# Patient Record
Sex: Male | Born: 1945 | Race: White | Hispanic: No | Marital: Married
Health system: Southern US, Community
[De-identification: ages and names within clinical notes are randomized; demographics above are authoritative.]

---

## 2004-10-07 ENCOUNTER — Ambulatory Visit: Payer: Self-pay | Admitting: Internal Medicine

## 2004-10-20 ENCOUNTER — Ambulatory Visit: Payer: Self-pay | Admitting: Internal Medicine

## 2004-11-20 ENCOUNTER — Ambulatory Visit: Payer: Self-pay | Admitting: Internal Medicine

## 2005-01-12 ENCOUNTER — Ambulatory Visit: Payer: Self-pay | Admitting: Internal Medicine

## 2005-01-20 ENCOUNTER — Ambulatory Visit: Payer: Self-pay | Admitting: Internal Medicine

## 2005-02-24 ENCOUNTER — Ambulatory Visit: Payer: Self-pay | Admitting: Internal Medicine

## 2005-03-22 ENCOUNTER — Ambulatory Visit: Payer: Self-pay | Admitting: Internal Medicine

## 2005-04-27 ENCOUNTER — Ambulatory Visit: Payer: Self-pay | Admitting: Internal Medicine

## 2005-05-20 ENCOUNTER — Ambulatory Visit: Payer: Self-pay | Admitting: Internal Medicine

## 2005-06-20 ENCOUNTER — Ambulatory Visit: Payer: Self-pay | Admitting: Internal Medicine

## 2005-07-20 ENCOUNTER — Ambulatory Visit: Payer: Self-pay | Admitting: Internal Medicine

## 2005-08-20 ENCOUNTER — Ambulatory Visit: Payer: Self-pay | Admitting: Internal Medicine

## 2005-09-19 ENCOUNTER — Ambulatory Visit: Payer: Self-pay | Admitting: Internal Medicine

## 2005-10-19 ENCOUNTER — Ambulatory Visit: Payer: Self-pay | Admitting: Internal Medicine

## 2005-10-20 ENCOUNTER — Ambulatory Visit: Payer: Self-pay | Admitting: Internal Medicine

## 2005-11-23 ENCOUNTER — Ambulatory Visit: Payer: Self-pay | Admitting: Internal Medicine

## 2005-12-20 ENCOUNTER — Ambulatory Visit: Payer: Self-pay | Admitting: Internal Medicine

## 2006-01-20 ENCOUNTER — Ambulatory Visit: Payer: Self-pay | Admitting: Internal Medicine

## 2006-02-19 ENCOUNTER — Ambulatory Visit: Payer: Self-pay | Admitting: Internal Medicine

## 2006-03-22 ENCOUNTER — Ambulatory Visit: Payer: Self-pay | Admitting: Internal Medicine

## 2006-04-22 ENCOUNTER — Ambulatory Visit: Payer: Self-pay | Admitting: Internal Medicine

## 2006-05-21 ENCOUNTER — Ambulatory Visit: Payer: Self-pay | Admitting: Internal Medicine

## 2006-06-21 ENCOUNTER — Ambulatory Visit: Payer: Self-pay | Admitting: Internal Medicine

## 2006-07-21 ENCOUNTER — Ambulatory Visit: Payer: Self-pay | Admitting: Internal Medicine

## 2006-08-21 ENCOUNTER — Ambulatory Visit: Payer: Self-pay | Admitting: Internal Medicine

## 2006-09-20 ENCOUNTER — Ambulatory Visit: Payer: Self-pay | Admitting: Internal Medicine

## 2006-10-21 ENCOUNTER — Ambulatory Visit: Payer: Self-pay | Admitting: Internal Medicine

## 2006-11-21 ENCOUNTER — Ambulatory Visit: Payer: Self-pay | Admitting: Internal Medicine

## 2006-12-21 ENCOUNTER — Ambulatory Visit: Payer: Self-pay | Admitting: Internal Medicine

## 2007-01-21 ENCOUNTER — Ambulatory Visit: Payer: Self-pay | Admitting: Internal Medicine

## 2007-02-20 ENCOUNTER — Ambulatory Visit: Payer: Self-pay | Admitting: Internal Medicine

## 2007-03-23 ENCOUNTER — Ambulatory Visit: Payer: Self-pay | Admitting: Internal Medicine

## 2007-04-23 ENCOUNTER — Ambulatory Visit: Payer: Self-pay | Admitting: Internal Medicine

## 2007-05-21 ENCOUNTER — Ambulatory Visit: Payer: Self-pay | Admitting: Internal Medicine

## 2007-06-21 ENCOUNTER — Ambulatory Visit: Payer: Self-pay | Admitting: Internal Medicine

## 2007-07-21 ENCOUNTER — Ambulatory Visit: Payer: Self-pay | Admitting: Internal Medicine

## 2007-08-21 ENCOUNTER — Ambulatory Visit: Payer: Self-pay | Admitting: Internal Medicine

## 2007-09-20 ENCOUNTER — Ambulatory Visit: Payer: Self-pay | Admitting: Internal Medicine

## 2007-10-21 ENCOUNTER — Ambulatory Visit: Payer: Self-pay | Admitting: Internal Medicine

## 2007-11-21 ENCOUNTER — Ambulatory Visit: Payer: Self-pay | Admitting: Internal Medicine

## 2007-12-21 ENCOUNTER — Ambulatory Visit: Payer: Self-pay | Admitting: Internal Medicine

## 2008-01-21 ENCOUNTER — Ambulatory Visit: Payer: Self-pay | Admitting: Internal Medicine

## 2008-02-20 ENCOUNTER — Ambulatory Visit: Payer: Self-pay | Admitting: Internal Medicine

## 2008-03-22 ENCOUNTER — Ambulatory Visit: Payer: Self-pay | Admitting: Internal Medicine

## 2008-04-22 ENCOUNTER — Ambulatory Visit: Payer: Self-pay | Admitting: Internal Medicine

## 2008-05-20 ENCOUNTER — Ambulatory Visit: Payer: Self-pay | Admitting: Internal Medicine

## 2008-06-20 ENCOUNTER — Ambulatory Visit: Payer: Self-pay | Admitting: Internal Medicine

## 2008-07-20 ENCOUNTER — Ambulatory Visit: Payer: Self-pay | Admitting: Internal Medicine

## 2008-08-20 ENCOUNTER — Ambulatory Visit: Payer: Self-pay | Admitting: Internal Medicine

## 2008-09-19 ENCOUNTER — Ambulatory Visit: Payer: Self-pay | Admitting: Internal Medicine

## 2008-10-20 ENCOUNTER — Ambulatory Visit: Payer: Self-pay | Admitting: Internal Medicine

## 2008-11-20 ENCOUNTER — Ambulatory Visit: Payer: Self-pay | Admitting: Internal Medicine

## 2008-12-06 ENCOUNTER — Observation Stay: Payer: Self-pay | Admitting: Oncology

## 2008-12-20 ENCOUNTER — Ambulatory Visit: Payer: Self-pay | Admitting: Internal Medicine

## 2009-01-20 ENCOUNTER — Ambulatory Visit: Payer: Self-pay | Admitting: Internal Medicine

## 2009-02-19 ENCOUNTER — Ambulatory Visit: Payer: Self-pay | Admitting: Internal Medicine

## 2009-03-22 ENCOUNTER — Ambulatory Visit: Payer: Self-pay | Admitting: Internal Medicine

## 2009-04-22 ENCOUNTER — Ambulatory Visit: Payer: Self-pay | Admitting: Internal Medicine

## 2009-05-15 ENCOUNTER — Emergency Department: Payer: Self-pay | Admitting: Emergency Medicine

## 2009-05-20 ENCOUNTER — Ambulatory Visit: Payer: Self-pay | Admitting: Internal Medicine

## 2009-06-20 ENCOUNTER — Ambulatory Visit: Payer: Self-pay | Admitting: Internal Medicine

## 2009-07-20 ENCOUNTER — Ambulatory Visit: Payer: Self-pay | Admitting: Internal Medicine

## 2009-08-20 ENCOUNTER — Ambulatory Visit: Payer: Self-pay | Admitting: Internal Medicine

## 2009-09-19 ENCOUNTER — Ambulatory Visit: Payer: Self-pay | Admitting: Internal Medicine

## 2009-10-20 ENCOUNTER — Ambulatory Visit: Payer: Self-pay | Admitting: Internal Medicine

## 2009-11-20 ENCOUNTER — Ambulatory Visit: Payer: Self-pay | Admitting: Internal Medicine

## 2009-12-20 ENCOUNTER — Ambulatory Visit: Payer: Self-pay | Admitting: Internal Medicine

## 2010-01-20 ENCOUNTER — Ambulatory Visit: Payer: Self-pay | Admitting: Internal Medicine

## 2010-02-19 ENCOUNTER — Ambulatory Visit: Payer: Self-pay | Admitting: Internal Medicine

## 2010-03-22 ENCOUNTER — Ambulatory Visit: Payer: Self-pay | Admitting: Internal Medicine

## 2010-04-22 ENCOUNTER — Ambulatory Visit: Payer: Self-pay | Admitting: Internal Medicine

## 2010-05-21 ENCOUNTER — Ambulatory Visit: Payer: Self-pay | Admitting: Internal Medicine

## 2010-06-21 ENCOUNTER — Ambulatory Visit: Payer: Self-pay | Admitting: Internal Medicine

## 2010-07-21 ENCOUNTER — Ambulatory Visit: Payer: Self-pay | Admitting: Internal Medicine

## 2010-08-21 ENCOUNTER — Ambulatory Visit: Payer: Self-pay | Admitting: Internal Medicine

## 2010-09-20 ENCOUNTER — Ambulatory Visit: Payer: Self-pay | Admitting: Internal Medicine

## 2010-10-21 ENCOUNTER — Ambulatory Visit: Payer: Self-pay | Admitting: Internal Medicine

## 2010-11-21 ENCOUNTER — Ambulatory Visit: Payer: Self-pay | Admitting: Internal Medicine

## 2010-12-21 ENCOUNTER — Ambulatory Visit: Payer: Self-pay | Admitting: Internal Medicine

## 2010-12-21 ENCOUNTER — Ambulatory Visit: Payer: Self-pay | Admitting: General Practice

## 2010-12-22 ENCOUNTER — Ambulatory Visit: Payer: Self-pay | Admitting: Internal Medicine

## 2011-01-21 ENCOUNTER — Ambulatory Visit: Payer: Self-pay | Admitting: Internal Medicine

## 2011-02-20 ENCOUNTER — Ambulatory Visit: Payer: Self-pay | Admitting: Internal Medicine

## 2011-03-23 ENCOUNTER — Ambulatory Visit: Payer: Self-pay | Admitting: Internal Medicine

## 2011-04-13 ENCOUNTER — Ambulatory Visit: Payer: Self-pay | Admitting: Surgery

## 2011-04-19 LAB — COMPREHENSIVE METABOLIC PANEL
Albumin: 3.9 g/dL (ref 3.4–5.0)
Anion Gap: 5 — ABNORMAL LOW (ref 7–16)
BUN: 21 mg/dL — ABNORMAL HIGH (ref 7–18)
Calcium, Total: 8.9 mg/dL (ref 8.5–10.1)
Chloride: 104 mmol/L (ref 98–107)
Co2: 33 mmol/L — ABNORMAL HIGH (ref 21–32)
Creatinine: 1.28 mg/dL (ref 0.60–1.30)
EGFR (African American): >60
EGFR (Non-African Amer.): >60
Glucose: 122 mg/dL — ABNORMAL HIGH (ref 65–99)
Osmolality: 287 (ref 275–301)
Potassium: 3.9 mmol/L (ref 3.5–5.1)
Sodium: 142 mmol/L (ref 136–145)
Total Protein: 7.1 g/dL (ref 6.4–8.2)

## 2011-04-19 LAB — CBC CANCER CENTER
Eosinophil #: 0 x10 3/mm (ref 0.0–0.7)
HCT: 45.6 % (ref 40.0–52.0)
Lymphocyte #: 1.8 x10 3/mm (ref 1.0–3.6)
MCH: 30.1 pg (ref 26.0–34.0)
MCHC: 34.4 g/dL (ref 32.0–36.0)
Monocyte #: 0.5 x10 3/mm (ref 0.0–0.7)
Neutrophil #: 3 x10 3/mm (ref 1.4–6.5)
Neutrophil %: 56.4 %
Platelet: 246 x10 3/mm (ref 150–440)

## 2011-04-20 LAB — KAPPA/LAMBDA FREE LIGHT CHAINS (ARMC)

## 2011-04-23 ENCOUNTER — Ambulatory Visit: Payer: Self-pay | Admitting: Internal Medicine

## 2011-05-01 IMAGING — CR RIGHT HIP - COMPLETE 2+ VIEW
1 series · 2 of 2 positions shown · non-contrast
Comparison: none

REASON FOR EXAM: myeloma
COMMENTS:

[Series 1: view not recorded · 0.17mm/px · 2 of 2 slices shown]
[im 1/2]
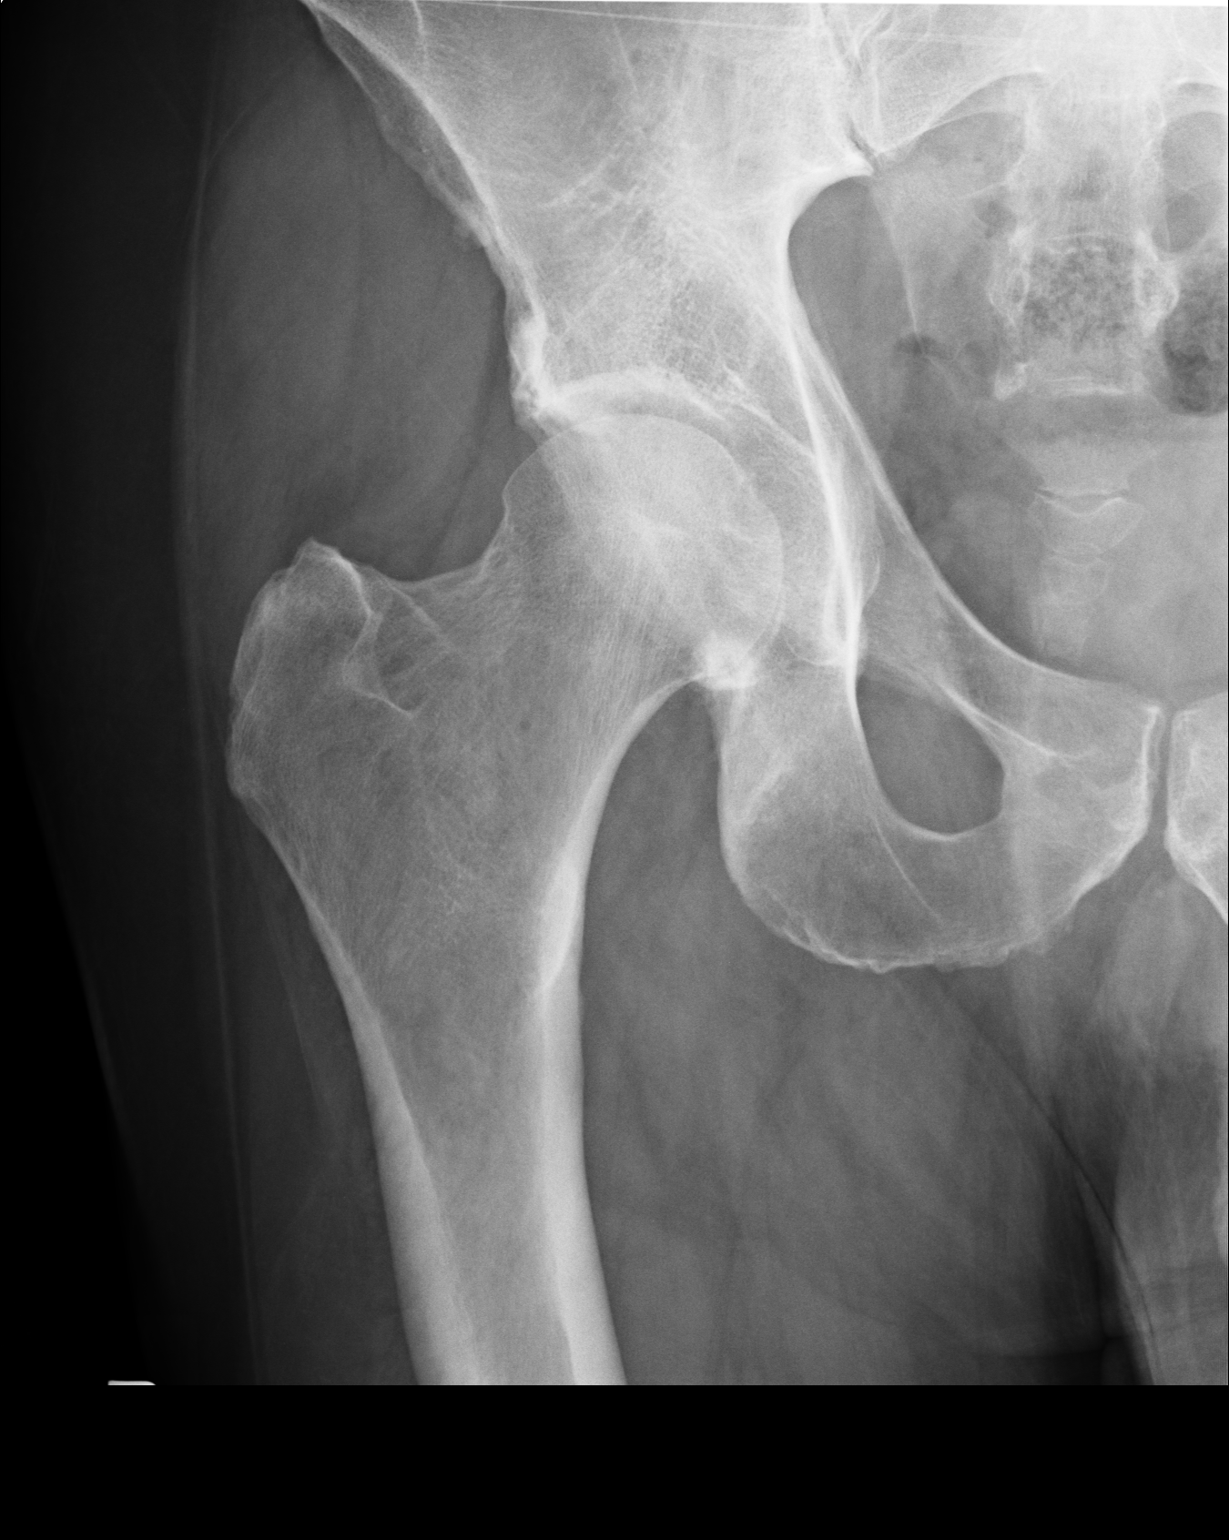
[im 2/2]
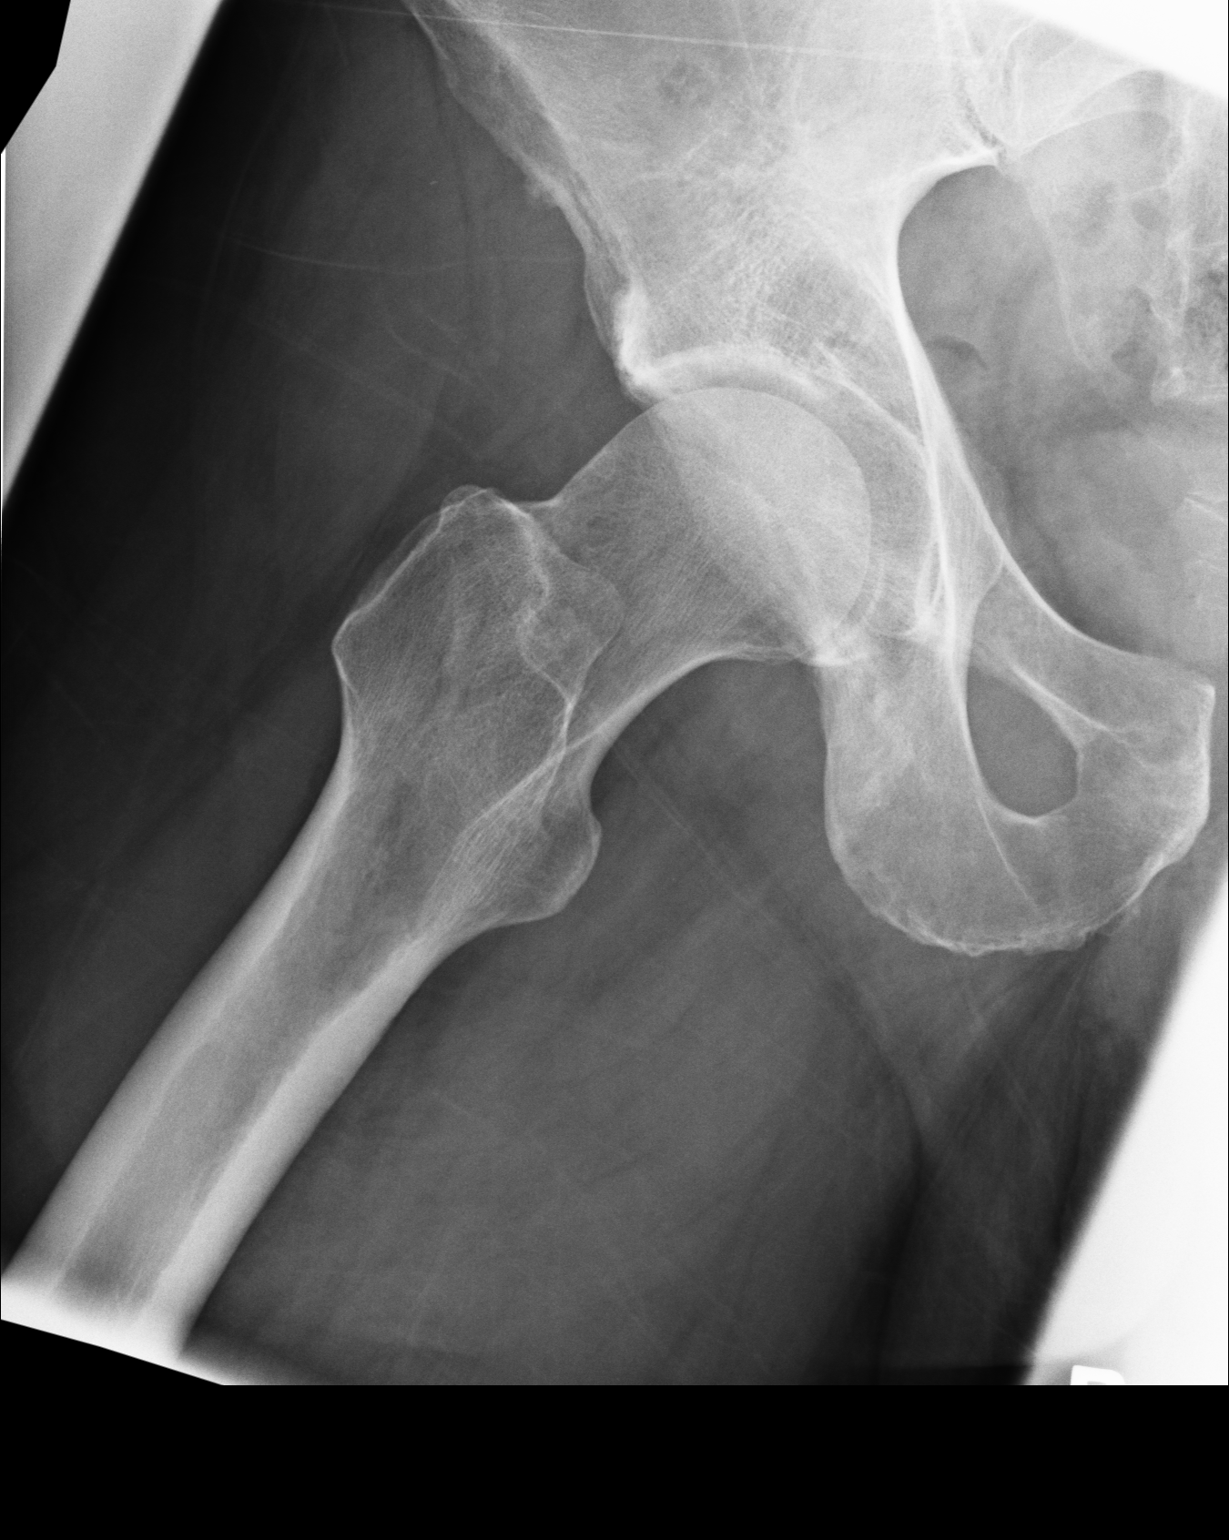

[2 of 2 positions shown; findings below may reference images not displayed]

PROCEDURE:     DXR - DXR HIP RIGHT COMPLETE  - July 15, 2009 [DATE]

RESULT:     Comparison is made to the prior exam 12/06/2008. No fracture or
dislocation is seen. There is noted mild demineralization the femoral neck,
similar to that noted on the left and which does not appear appreciably
changed since the prior exam. Findings consequently are felt to unlikely be
secondary to multiple myeloma. The hip joint space is well-maintained.
IMPRESSION: No acute changes are identified.

## 2011-05-01 IMAGING — CR DG HUMERUS 2V *R*
1 series · 2 of 2 positions shown · non-contrast
Comparison: none

REASON FOR EXAM: myeloma
COMMENTS:

PROCEDURE:     DXR - DXR HUMERUS RIGHT  - July 15, 2009 [DATE]
RESULT:     Two views of the right humerus were obtained and are compared to
the appearance of the humerus on prior exam of 08/20/2008. No fracture is
seen. No lytic or blastic lesions are identified.

[Series 1: view not recorded · 0.17mm/px · 2 of 2 slices shown]
[im 1/2]
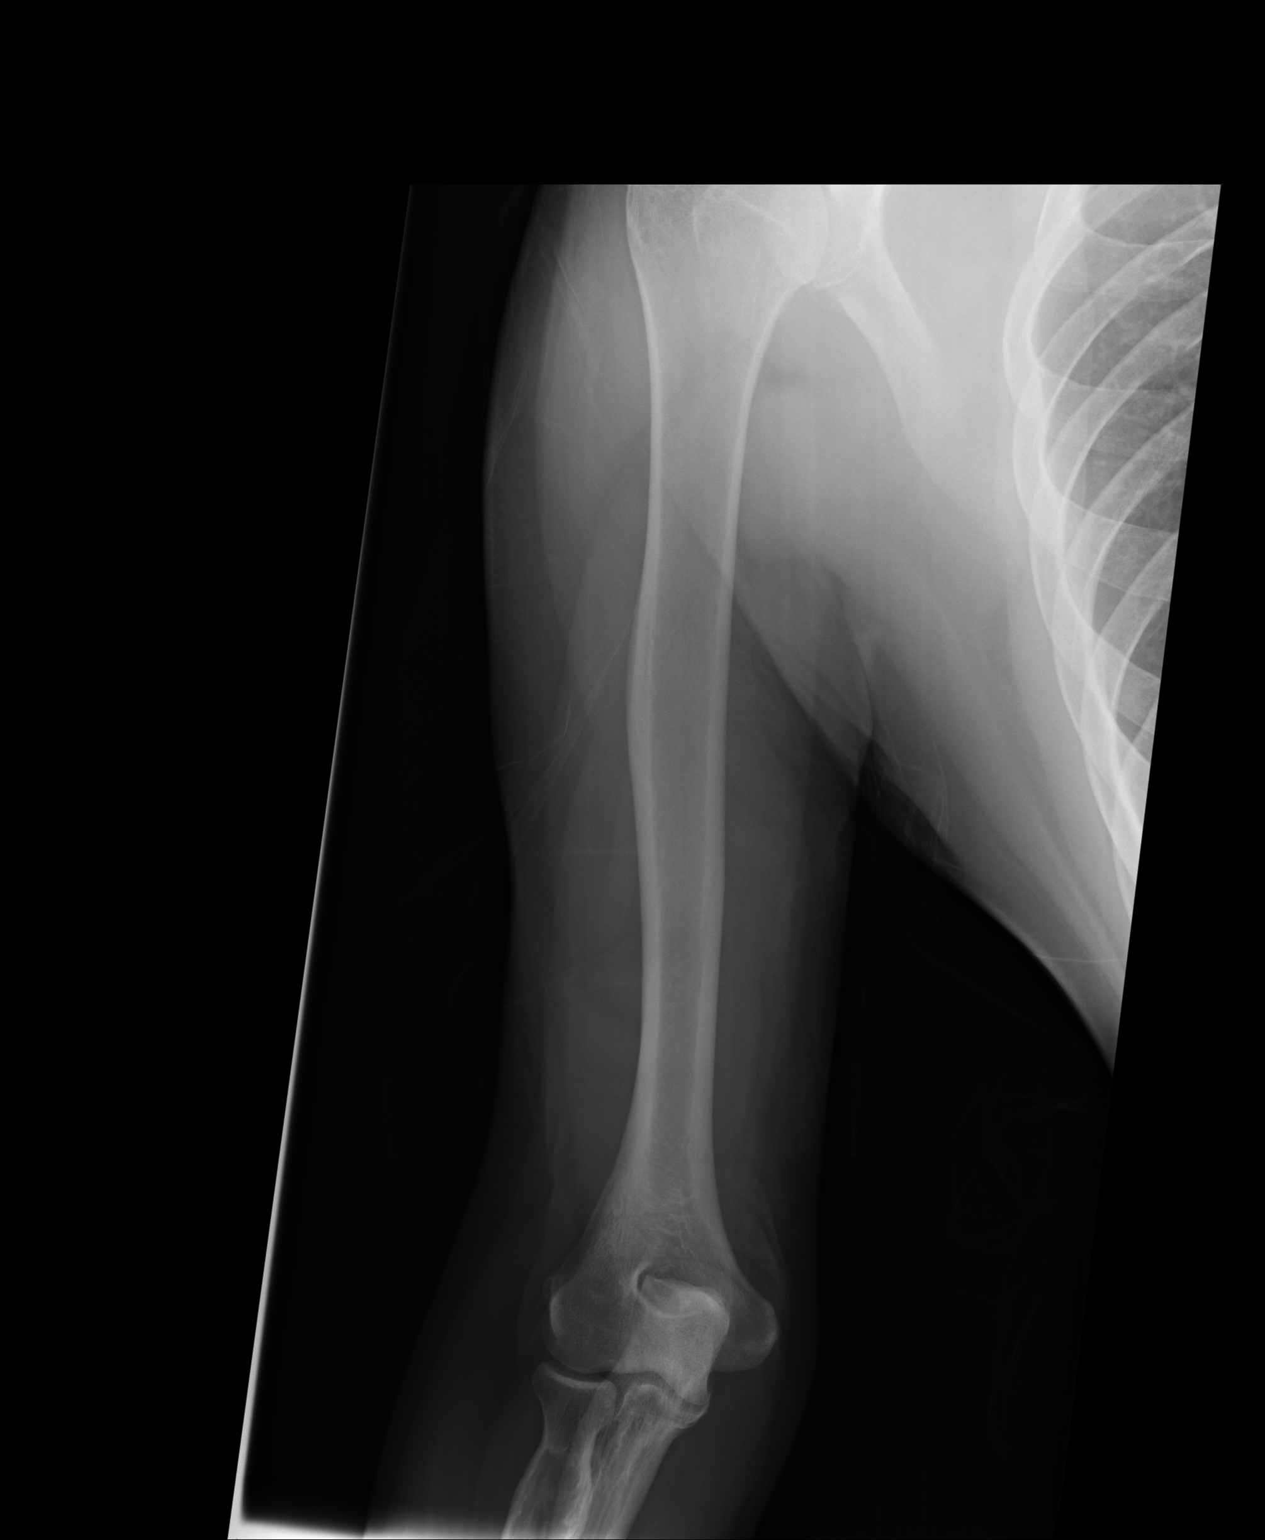
[im 2/2]
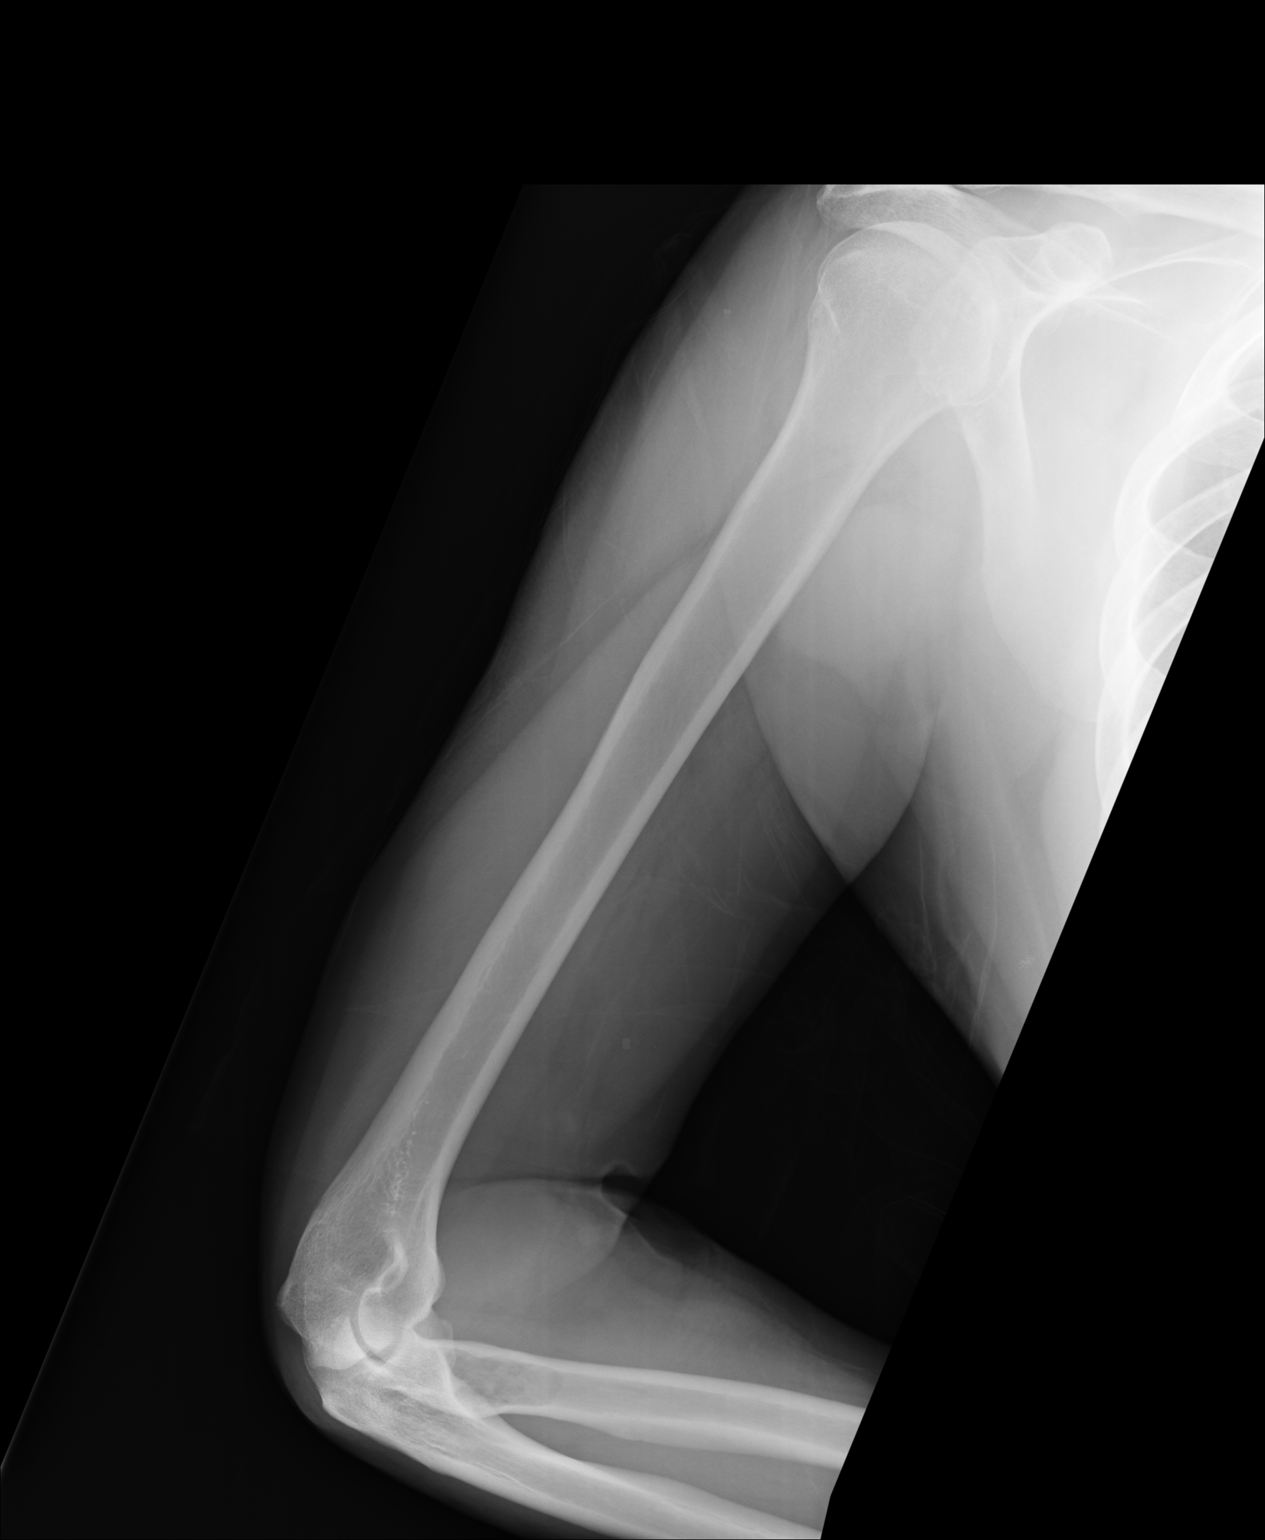

[2 of 2 positions shown; findings below may reference images not displayed]

IMPRESSION: No significant osseous abnormalities are noted.

## 2011-05-01 IMAGING — CR DG FEMUR 2V*L*
1 series · 4 of 4 positions shown · non-contrast
Comparison: none

REASON FOR EXAM: myeloma
COMMENTS:

PROCEDURE:     DXR - DXR FEMUR LEFT  - July 15, 2009 [DATE]
RESULT:     Two views of the left femur are compared to the appearance of
the left femur on a prior exam of 08/20/2008. No fracture is seen. No definite
lytic or blastic lesions are identified.

[Series 1: view not recorded · 0.17mm/px · 4 of 4 slices shown]
[im 1/4]
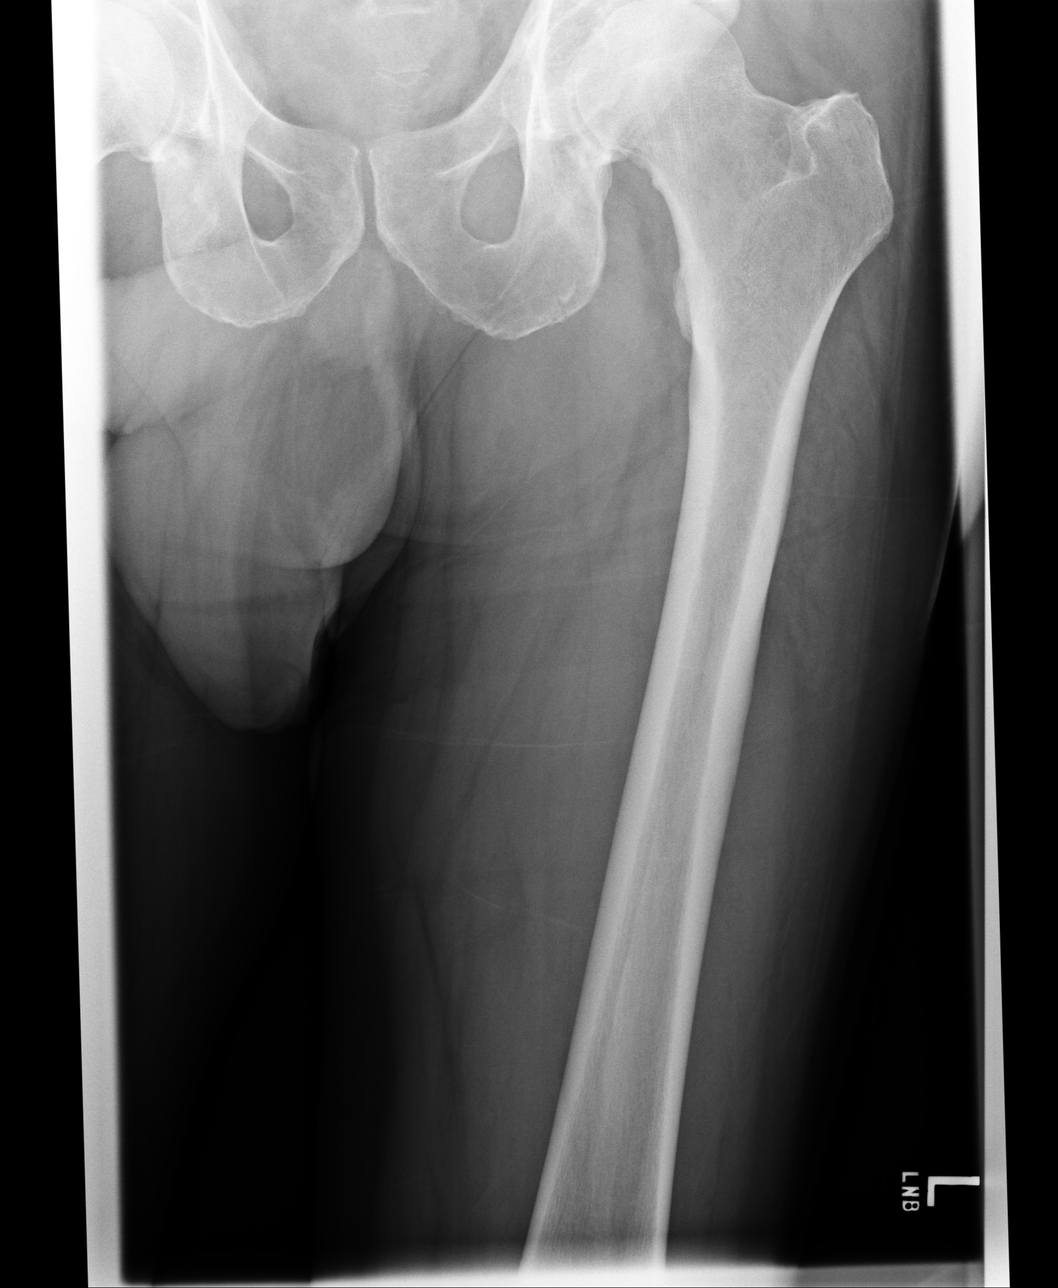
[im 2/4]
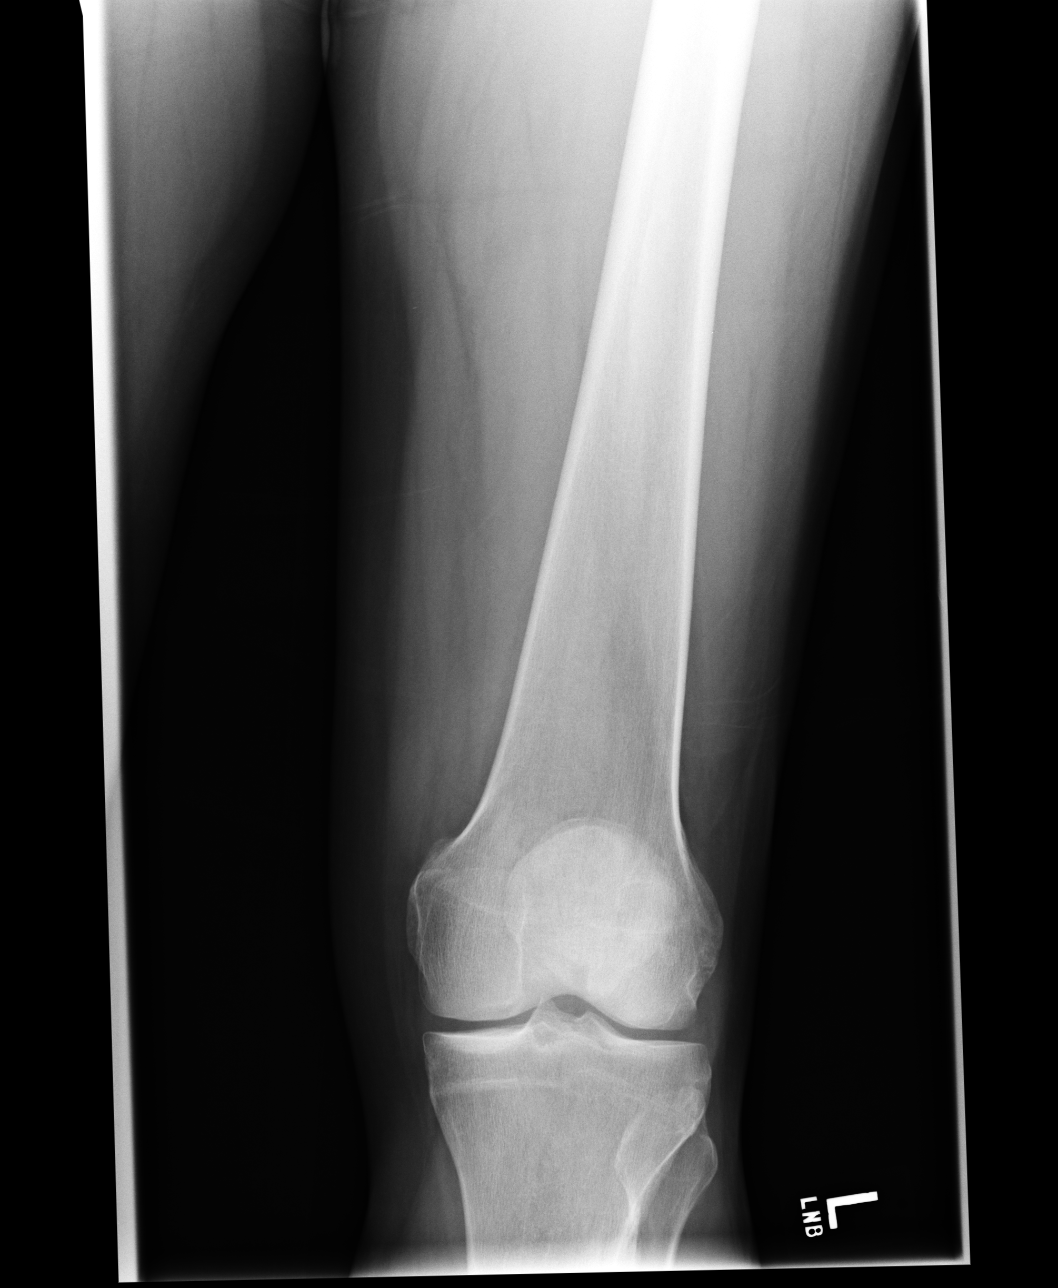
[im 3/4]
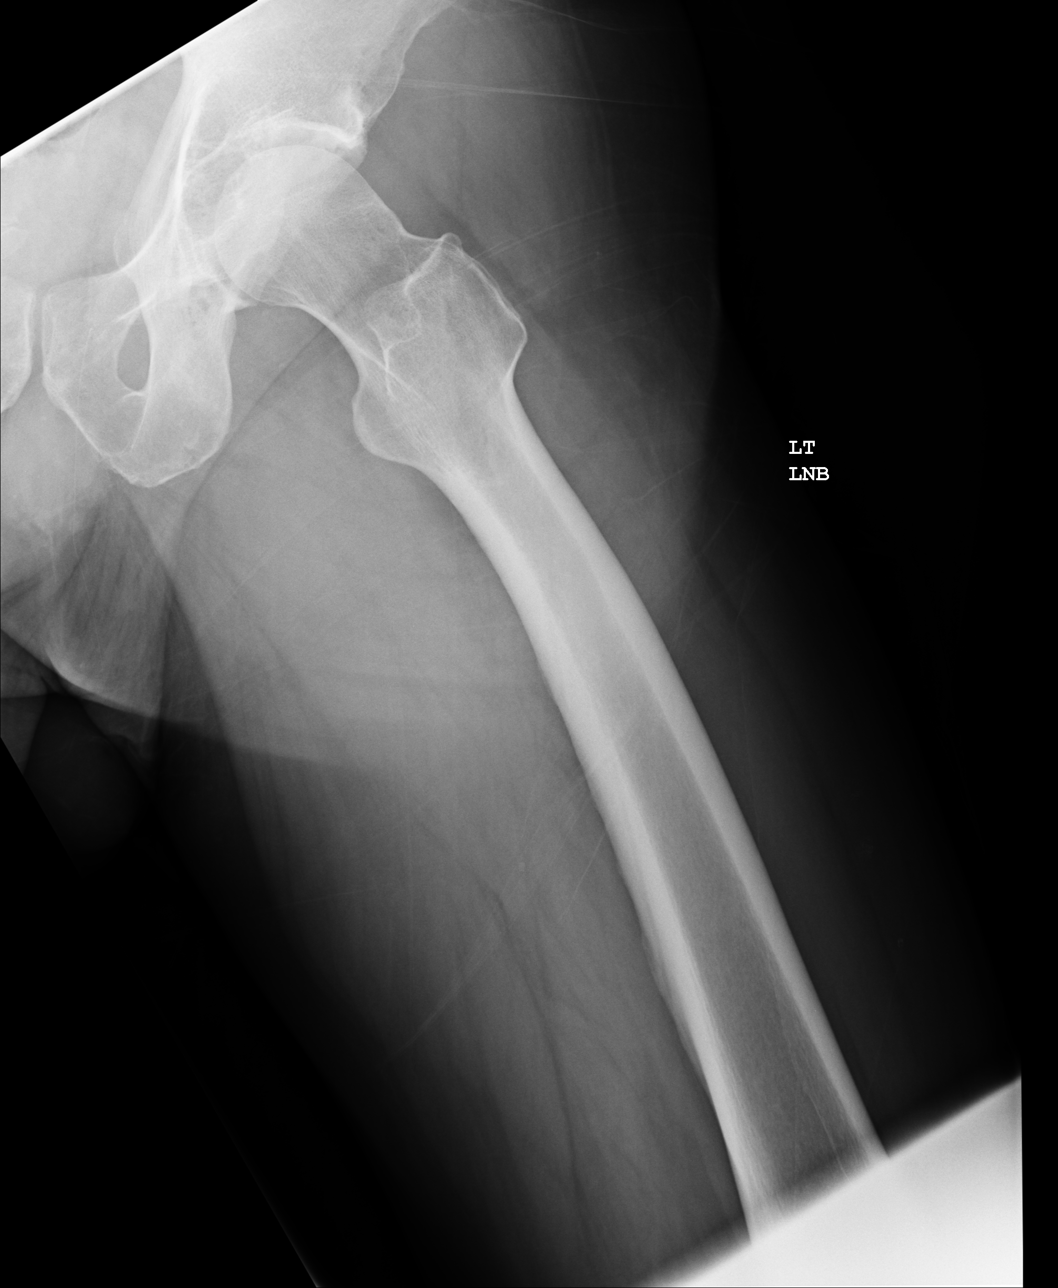
[im 4/4]
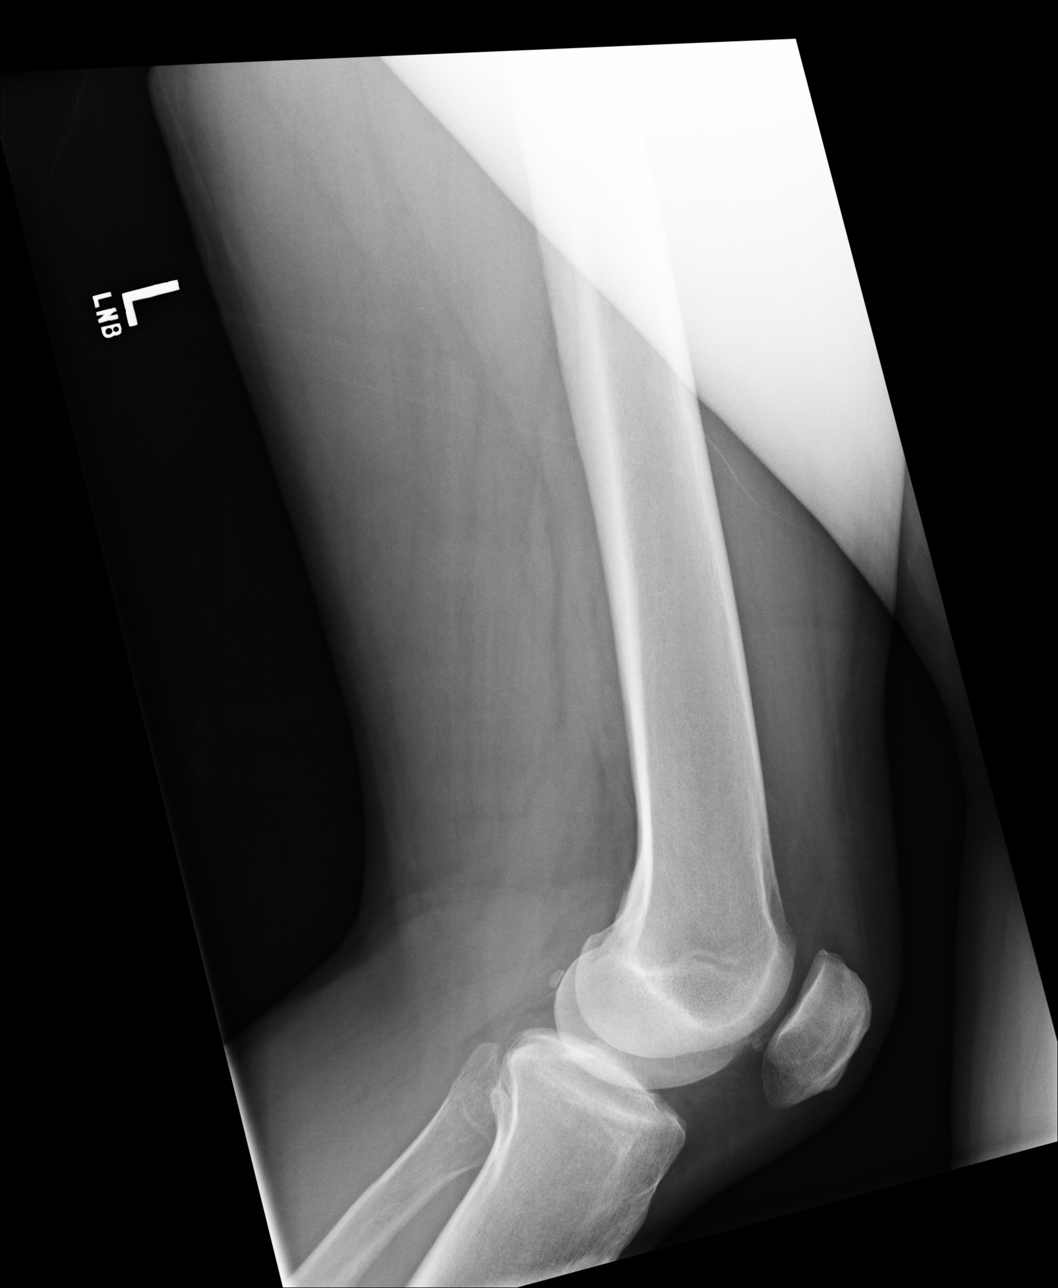

[4 of 4 positions shown; findings below may reference images not displayed]

IMPRESSION: No significant osseous abnormalities are noted.

## 2011-05-01 IMAGING — CR DG HIP COMPLETE 2+V*L*
1 series · 2 of 2 positions shown · non-contrast
Comparison: none

REASON FOR EXAM: myeloma
COMMENTS:

[Series 1: view not recorded · 0.17mm/px · 2 of 2 slices shown]
[im 1/2]
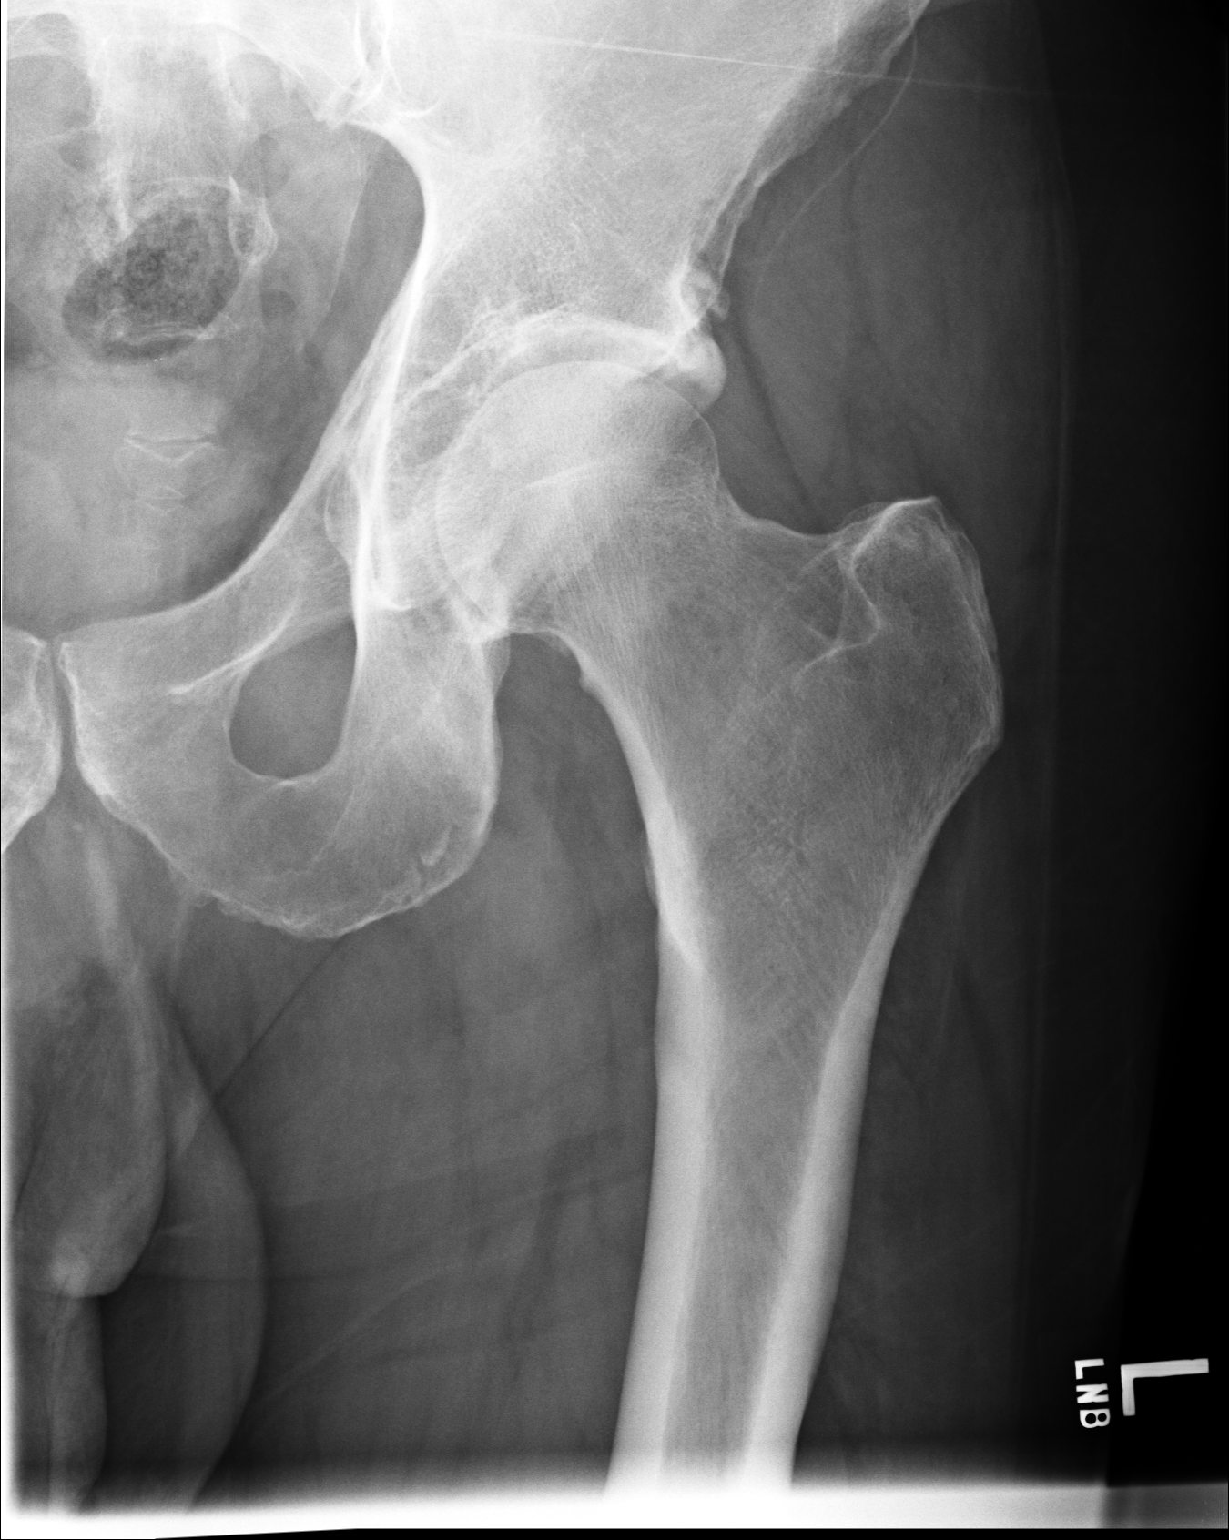
[im 2/2]
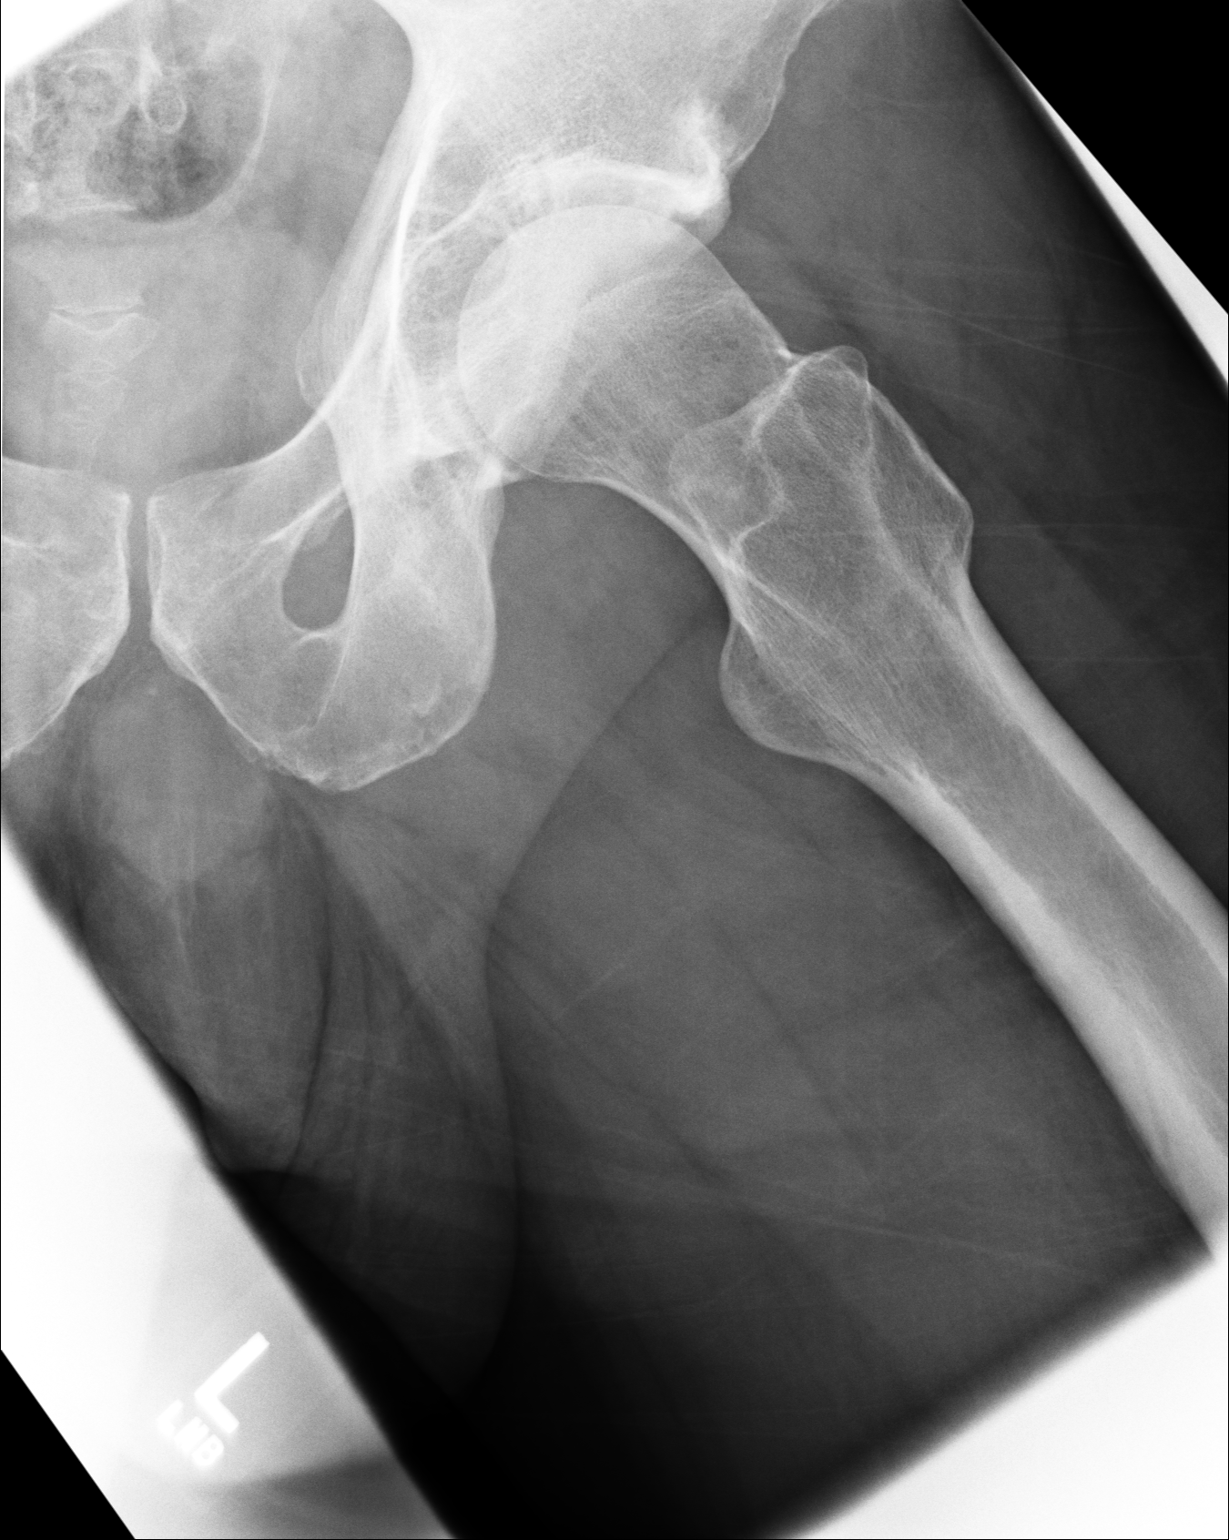

[2 of 2 positions shown; findings below may reference images not displayed]

PROCEDURE:     DXR - DXR HIP LEFT COMPLETE  - July 15, 2009 [DATE]

RESULT:     Comparison is made to the appearance noted on prior exam
12/06/2008. No significant interval changes are seen. There a few poorly
defined lucencies in the femoral neck compatible with cystic osteoporosis
but which do not show progressive changes suggestive of multiple myeloma.
The hip joint space is well-maintained.
IMPRESSION: No acute bony abnormalities are identified.

## 2011-05-10 LAB — CREATININE, SERUM
EGFR (African American): >60
EGFR (Non-African Amer.): >60

## 2011-05-11 LAB — KAPPA/LAMBDA FREE LIGHT CHAINS (ARMC)

## 2011-05-21 ENCOUNTER — Ambulatory Visit: Payer: Self-pay | Admitting: Internal Medicine

## 2011-06-07 LAB — CBC CANCER CENTER
Basophil #: 0 "x10 3/mm "
Basophil %: 0.6 %
Eosinophil #: 0.1 "x10 3/mm "
Eosinophil %: 0.9 %
HCT: 45.5 %
HGB: 15.8 g/dL
Lymphocyte %: 41.8 %
Lymphs Abs: 2.3 "x10 3/mm "
MCH: 30.8 pg
MCHC: 34.6 g/dL
MCV: 89 fL
Monocyte #: 0.5 "x10 3/mm "
Monocyte %: 8.6 %
Neutrophil #: 2.7 "x10 3/mm "
Neutrophil %: 48.1 %
Platelet: 287 "x10 3/mm "
RBC: 5.11 "x10 6/mm "
RDW: 13.4 %
WBC: 5.6 "x10 3/mm "

## 2011-06-07 LAB — CREATININE, SERUM
Creatinine: 1.28 mg/dL
EGFR (African American): >60
EGFR (Non-African Amer.): >60

## 2011-06-07 LAB — CALCIUM: Calcium, Total: 8.7 mg/dL (ref 8.5–10.1)

## 2011-06-08 LAB — PROT IMMUNOELECTROPHORES(ARMC)

## 2011-06-08 LAB — KAPPA/LAMBDA FREE LIGHT CHAINS (ARMC)

## 2011-06-21 ENCOUNTER — Ambulatory Visit: Payer: Self-pay | Admitting: Internal Medicine

## 2011-06-29 ENCOUNTER — Ambulatory Visit: Payer: Self-pay | Admitting: Internal Medicine

## 2011-06-29 LAB — CREATININE, SERUM
Creatinine: 1.27 mg/dL (ref 0.60–1.30)
EGFR (Non-African Amer.): >60

## 2011-06-29 LAB — CBC CANCER CENTER
Eosinophil %: 0.8 %
HCT: 43.4 % (ref 40.0–52.0)
HGB: 14.9 g/dL (ref 13.0–18.0)
Lymphocyte #: 2.4 x10 3/mm (ref 1.0–3.6)
MCHC: 34.4 g/dL (ref 32.0–36.0)
Monocyte #: 0.5 x10 3/mm (ref 0.0–0.7)
Monocyte %: 8.1 %
Neutrophil #: 3.2 x10 3/mm (ref 1.4–6.5)
Neutrophil %: 52.1 %
Platelet: 229 x10 3/mm (ref 150–440)
RBC: 4.91 10*6/uL (ref 4.40–5.90)
WBC: 6.2 x10 3/mm (ref 3.8–10.6)

## 2011-06-30 LAB — KAPPA/LAMBDA FREE LIGHT CHAINS (ARMC)

## 2011-07-05 ENCOUNTER — Ambulatory Visit: Payer: Self-pay | Admitting: Gastroenterology

## 2011-07-21 ENCOUNTER — Ambulatory Visit: Payer: Self-pay | Admitting: Internal Medicine

## 2011-07-26 LAB — CBC CANCER CENTER
Basophil #: 0 x10 3/mm (ref 0.0–0.1)
Basophil %: 0.6 %
Eosinophil #: 0 x10 3/mm (ref 0.0–0.7)
Eosinophil %: 0.8 %
HCT: 45.2 % (ref 40.0–52.0)
HGB: 14.9 g/dL (ref 13.0–18.0)
Lymphocyte #: 2.6 x10 3/mm (ref 1.0–3.6)
MCH: 30 pg (ref 26.0–34.0)
MCV: 91 fL (ref 80–100)
Monocyte #: 0.5 x10 3/mm (ref 0.2–1.0)
Monocyte %: 8.8 %
Neutrophil #: 2.2 x10 3/mm (ref 1.4–6.5)
Neutrophil %: 40.7 %
RBC: 4.98 10*6/uL (ref 4.40–5.90)
RDW: 13.7 % (ref 11.5–14.5)
WBC: 5.4 x10 3/mm (ref 3.8–10.6)

## 2011-07-26 LAB — CREATININE, SERUM
Creatinine: 1.03 mg/dL (ref 0.60–1.30)
EGFR (African American): >60
EGFR (Non-African Amer.): >60

## 2011-07-26 LAB — CALCIUM: Calcium, Total: 9.1 mg/dL (ref 8.5–10.1)

## 2011-07-26 LAB — URIC ACID: Uric Acid: 5.6 mg/dL (ref 3.5–7.2)

## 2011-07-27 LAB — PROT IMMUNOELECTROPHORES(ARMC)

## 2011-07-27 LAB — KAPPA/LAMBDA FREE LIGHT CHAINS (ARMC)

## 2011-08-21 ENCOUNTER — Ambulatory Visit: Payer: Self-pay | Admitting: Internal Medicine

## 2011-08-25 LAB — CBC CANCER CENTER
Basophil %: 0.2 %
HCT: 44.4 % (ref 40.0–52.0)
HGB: 14.6 g/dL (ref 13.0–18.0)
Lymphocyte #: 2.6 x10 3/mm (ref 1.0–3.6)
Lymphocyte %: 38.1 %
MCH: 29.9 pg (ref 26.0–34.0)
MCHC: 32.9 g/dL (ref 32.0–36.0)
Monocyte #: 0.7 x10 3/mm (ref 0.2–1.0)
Neutrophil #: 3.5 x10 3/mm (ref 1.4–6.5)
Neutrophil %: 51.1 %
RBC: 4.88 10*6/uL (ref 4.40–5.90)
RDW: 13.7 % (ref 11.5–14.5)

## 2011-08-25 LAB — COMPREHENSIVE METABOLIC PANEL
Albumin: 3.8 g/dL (ref 3.4–5.0)
Alkaline Phosphatase: 71 U/L (ref 50–136)
Anion Gap: 7 (ref 7–16)
Calcium, Total: 8.8 mg/dL (ref 8.5–10.1)
Creatinine: 1.19 mg/dL (ref 0.60–1.30)
EGFR (African American): >60
EGFR (Non-African Amer.): >60
Glucose: 101 mg/dL — ABNORMAL HIGH (ref 65–99)
Osmolality: 287 (ref 275–301)
Potassium: 3.8 mmol/L (ref 3.5–5.1)
SGPT (ALT): 29 U/L
Sodium: 143 mmol/L (ref 136–145)
Total Protein: 6.9 g/dL (ref 6.4–8.2)

## 2011-08-26 LAB — PROT IMMUNOELECTROPHORES(ARMC)

## 2011-08-26 LAB — KAPPA/LAMBDA FREE LIGHT CHAINS (ARMC)

## 2011-09-20 ENCOUNTER — Ambulatory Visit: Payer: Self-pay | Admitting: Internal Medicine

## 2011-09-20 LAB — CBC CANCER CENTER
Basophil #: 0 10*3/uL
Basophil %: 0.5 %
Eosinophil #: 0.1 10*3/uL
Eosinophil %: 1.8 %
HCT: 44.2 %
HGB: 14.8 g/dL
Lymphocyte %: 46.6 %
Lymphs Abs: 2.9 10*3/uL
MCH: 30.2 pg
MCHC: 33.4 g/dL
MCV: 90 fL
Monocyte #: 0.5 10*3/uL
Monocyte %: 8.2 %
Neutrophil #: 2.6 10*3/uL
Neutrophil %: 42.9 %
Platelet: 237 10*3/uL
RBC: 4.89 10*6/uL
RDW: 13.1 %
WBC: 6.1 10*3/uL

## 2011-09-20 LAB — CREATININE, SERUM
Creatinine: 1.28 mg/dL
EGFR (African American): >60
EGFR (Non-African Amer.): 59 — ABNORMAL LOW

## 2011-09-20 LAB — URIC ACID: Uric Acid: 6.6 mg/dL

## 2011-09-20 LAB — CALCIUM: Calcium, Total: 8.7 mg/dL (ref 8.5–10.1)

## 2011-09-22 LAB — PROT IMMUNOELECTROPHORES(ARMC)

## 2011-09-24 LAB — CBC CANCER CENTER
Basophil #: 0.3 x10 3/mm — ABNORMAL HIGH (ref 0.0–0.1)
Basophil %: 3.8 %
HCT: 43.8 % (ref 40.0–52.0)
Lymphocyte #: 2.7 x10 3/mm (ref 1.0–3.6)
Lymphocyte %: 37.6 %
MCH: 30.4 pg (ref 26.0–34.0)
MCV: 91 fL (ref 80–100)
Monocyte #: 0.6 x10 3/mm (ref 0.2–1.0)
Monocyte %: 8.1 %
Neutrophil %: 49.7 %
Platelet: 233 x10 3/mm (ref 150–440)
RBC: 4.83 10*6/uL (ref 4.40–5.90)
RDW: 13.1 % (ref 11.5–14.5)
WBC: 7.2 x10 3/mm (ref 3.8–10.6)

## 2011-09-27 LAB — CBC CANCER CENTER
Basophil #: 0 x10 3/mm (ref 0.0–0.1)
Eosinophil #: 0.1 x10 3/mm (ref 0.0–0.7)
Eosinophil %: 0.9 %
HCT: 42.3 % (ref 40.0–52.0)
Lymphocyte %: 44.9 %
Monocyte %: 10.7 %
Neutrophil #: 3.1 x10 3/mm (ref 1.4–6.5)
Neutrophil %: 43.1 %
RDW: 13.4 % (ref 11.5–14.5)
WBC: 7.1 x10 3/mm (ref 3.8–10.6)

## 2011-09-27 LAB — CREATININE, SERUM
Creatinine: 1.3 mg/dL (ref 0.60–1.30)
EGFR (Non-African Amer.): 58 — ABNORMAL LOW

## 2011-09-30 LAB — CBC CANCER CENTER
Basophil %: 0.7 %
Eosinophil #: 0.1 x10 3/mm (ref 0.0–0.7)
HGB: 14.4 g/dL (ref 13.0–18.0)
Lymphocyte %: 52.3 %
MCH: 30.7 pg (ref 26.0–34.0)
MCV: 91 fL (ref 80–100)
Monocyte #: 0.7 x10 3/mm (ref 0.2–1.0)
Monocyte %: 10.7 %
Neutrophil %: 35.3 %
Platelet: 165 x10 3/mm (ref 150–440)
RDW: 13.2 % (ref 11.5–14.5)
WBC: 6.8 x10 3/mm (ref 3.8–10.6)

## 2011-10-04 LAB — HEPATIC FUNCTION PANEL A (ARMC)
Albumin: 3.7 g/dL (ref 3.4–5.0)
Bilirubin,Total: 0.3 mg/dL (ref 0.2–1.0)
SGPT (ALT): 31 U/L
Total Protein: 7 g/dL (ref 6.4–8.2)

## 2011-10-04 LAB — CBC CANCER CENTER
Basophil %: 0.3 %
Eosinophil #: 0 x10 3/mm (ref 0.0–0.7)
Eosinophil %: 0.4 %
HCT: 44 % (ref 40.0–52.0)
Lymphocyte %: 41.9 %
MCH: 30 pg (ref 26.0–34.0)
MCHC: 33 g/dL (ref 32.0–36.0)
Monocyte #: 0.7 x10 3/mm (ref 0.2–1.0)
Monocyte %: 9.9 %
Neutrophil %: 47.5 %
Platelet: 113 x10 3/mm — ABNORMAL LOW (ref 150–440)
RBC: 4.84 10*6/uL (ref 4.40–5.90)
WBC: 7 x10 3/mm (ref 3.8–10.6)

## 2011-10-04 LAB — LIPID PANEL
Cholesterol: 200 mg/dL (ref 0–200)
HDL Cholesterol: 42 mg/dL (ref 40–60)
Ldl Cholesterol, Calc: 91 mg/dL (ref 0–100)
Triglycerides: 333 mg/dL — ABNORMAL HIGH (ref 0–200)
VLDL Cholesterol, Calc: 67 mg/dL — ABNORMAL HIGH (ref 5–40)

## 2011-10-04 LAB — CREATININE, SERUM
Creatinine: 1.27 mg/dL (ref 0.60–1.30)
EGFR (African American): >60

## 2011-10-04 LAB — CALCIUM: Calcium, Total: 9.2 mg/dL (ref 8.5–10.1)

## 2011-10-05 LAB — PROT IMMUNOELECTROPHORES(ARMC)

## 2011-10-11 LAB — CBC CANCER CENTER
Basophil %: 0.1 %
Eosinophil #: 0 x10 3/mm (ref 0.0–0.7)
Eosinophil %: 0.3 %
HCT: 43.7 % (ref 40.0–52.0)
HGB: 14.3 g/dL (ref 13.0–18.0)
Lymphocyte %: 24.3 %
MCH: 30.1 pg (ref 26.0–34.0)
MCHC: 32.9 g/dL (ref 32.0–36.0)
Neutrophil #: 5.1 x10 3/mm (ref 1.4–6.5)
RBC: 4.76 10*6/uL (ref 4.40–5.90)

## 2011-10-14 LAB — CBC CANCER CENTER
Basophil #: 0 x10 3/mm (ref 0.0–0.1)
Basophil %: 0.5 %
Eosinophil %: 0.5 %
HGB: 14.5 g/dL (ref 13.0–18.0)
Lymphocyte #: 1.8 x10 3/mm (ref 1.0–3.6)
MCH: 31 pg (ref 26.0–34.0)
MCHC: 33.7 g/dL (ref 32.0–36.0)
Monocyte #: 0.6 x10 3/mm (ref 0.2–1.0)
Monocyte %: 8.5 %
Neutrophil #: 4.6 x10 3/mm (ref 1.4–6.5)
Neutrophil %: 65.4 %
Platelet: 219 x10 3/mm (ref 150–440)
RDW: 13.7 % (ref 11.5–14.5)
WBC: 7.1 x10 3/mm (ref 3.8–10.6)

## 2011-10-18 LAB — CBC CANCER CENTER
Basophil #: 0 x10 3/mm (ref 0.0–0.1)
Eosinophil #: 0 x10 3/mm (ref 0.0–0.7)
HCT: 41.5 % (ref 40.0–52.0)
Lymphocyte #: 2.1 x10 3/mm (ref 1.0–3.6)
Lymphocyte %: 34.2 %
MCH: 31.5 pg (ref 26.0–34.0)
MCHC: 34.5 g/dL (ref 32.0–36.0)
MCV: 91 fL (ref 80–100)
Monocyte #: 0.6 x10 3/mm (ref 0.2–1.0)
Monocyte %: 9.7 %
Neutrophil #: 3.5 x10 3/mm (ref 1.4–6.5)
Platelet: 152 x10 3/mm (ref 150–440)
RDW: 13.2 % (ref 11.5–14.5)

## 2011-10-18 LAB — CREATININE, SERUM: Creatinine: 1.41 mg/dL — ABNORMAL HIGH (ref 0.60–1.30)

## 2011-10-21 ENCOUNTER — Ambulatory Visit: Payer: Self-pay | Admitting: Internal Medicine

## 2011-10-21 LAB — CBC CANCER CENTER
Basophil %: 0.1 %
Eosinophil %: 0.1 %
HGB: 15.2 g/dL (ref 13.0–18.0)
Lymphocyte #: 2.8 x10 3/mm (ref 1.0–3.6)
Lymphocyte %: 38.8 %
MCH: 31 pg (ref 26.0–34.0)
MCHC: 33.9 g/dL (ref 32.0–36.0)
Monocyte #: 1 x10 3/mm (ref 0.2–1.0)
Monocyte %: 13.8 %
Neutrophil #: 3.4 x10 3/mm (ref 1.4–6.5)
Neutrophil %: 47.2 %
Platelet: 100 x10 3/mm — ABNORMAL LOW (ref 150–440)
RDW: 13.7 % (ref 11.5–14.5)

## 2011-10-25 LAB — HEPATIC FUNCTION PANEL A (ARMC)
Albumin: 3.4 g/dL (ref 3.4–5.0)
Alkaline Phosphatase: 69 U/L (ref 50–136)
Bilirubin, Direct: 0.1 mg/dL (ref 0.00–0.20)
SGOT(AST): 17 U/L (ref 15–37)
SGPT (ALT): 34 U/L (ref 12–78)

## 2011-10-25 LAB — CREATININE, SERUM
Creatinine: 1.23 mg/dL (ref 0.60–1.30)
EGFR (African American): >60
EGFR (Non-African Amer.): >60

## 2011-10-25 LAB — CBC CANCER CENTER
Basophil #: 0 x10 3/mm (ref 0.0–0.1)
Basophil %: 0.3 %
Eosinophil #: 0 x10 3/mm (ref 0.0–0.7)
HCT: 40.2 % (ref 40.0–52.0)
Lymphocyte #: 1.8 x10 3/mm (ref 1.0–3.6)
Lymphocyte %: 23.3 %
MCH: 31.1 pg (ref 26.0–34.0)
MCHC: 34.1 g/dL (ref 32.0–36.0)
Monocyte #: 0.8 x10 3/mm (ref 0.2–1.0)
Neutrophil #: 5 x10 3/mm (ref 1.4–6.5)
Neutrophil %: 65.9 %
RDW: 13.3 % (ref 11.5–14.5)
WBC: 7.6 x10 3/mm (ref 3.8–10.6)

## 2011-10-26 LAB — KAPPA/LAMBDA FREE LIGHT CHAINS (ARMC)

## 2011-11-08 LAB — CBC CANCER CENTER
Basophil %: 0.6 %
HCT: 41.3 % (ref 40.0–52.0)
HGB: 13.8 g/dL (ref 13.0–18.0)
Lymphocyte %: 36.1 %
MCHC: 33.4 g/dL (ref 32.0–36.0)
Monocyte %: 10.7 %
Neutrophil #: 2.6 x10 3/mm (ref 1.4–6.5)
RBC: 4.44 10*6/uL (ref 4.40–5.90)
WBC: 5 x10 3/mm (ref 3.8–10.6)

## 2011-11-11 LAB — CBC CANCER CENTER
Basophil #: 0 x10 3/mm (ref 0.0–0.1)
Basophil %: 0.4 %
Eosinophil #: 0 x10 3/mm (ref 0.0–0.7)
Eosinophil %: 0.7 %
HCT: 44.5 % (ref 40.0–52.0)
HGB: 14.7 g/dL (ref 13.0–18.0)
Lymphocyte %: 31.8 %
MCV: 93 fL (ref 80–100)
Monocyte %: 9.4 %
Neutrophil #: 4 x10 3/mm (ref 1.4–6.5)
Neutrophil %: 57.7 %
RDW: 14.1 % (ref 11.5–14.5)
WBC: 6.9 x10 3/mm (ref 3.8–10.6)

## 2011-11-18 LAB — CBC CANCER CENTER
Basophil #: 0 x10 3/mm (ref 0.0–0.1)
Basophil %: 0.3 %
Eosinophil #: 0.1 x10 3/mm (ref 0.0–0.7)
Eosinophil %: 1.1 %
HGB: 14.3 g/dL (ref 13.0–18.0)
Lymphocyte %: 39.8 %
MCHC: 32.7 g/dL (ref 32.0–36.0)
Neutrophil %: 48 %
RBC: 4.71 10*6/uL (ref 4.40–5.90)
RDW: 13.8 % (ref 11.5–14.5)

## 2011-11-21 ENCOUNTER — Ambulatory Visit: Payer: Self-pay | Admitting: Internal Medicine

## 2011-11-23 LAB — CBC CANCER CENTER
Basophil %: 0.2 %
Eosinophil #: 0 x10 3/mm (ref 0.0–0.7)
Eosinophil %: 0.4 %
HGB: 14.2 g/dL (ref 13.0–18.0)
Lymphocyte %: 30 %
MCH: 30.6 pg (ref 26.0–34.0)
MCHC: 33.1 g/dL (ref 32.0–36.0)
MCV: 93 fL (ref 80–100)
Monocyte #: 0.5 x10 3/mm (ref 0.2–1.0)
Neutrophil #: 3.7 x10 3/mm (ref 1.4–6.5)
Neutrophil %: 61 %
RBC: 4.64 10*6/uL (ref 4.40–5.90)
RDW: 13.4 % (ref 11.5–14.5)
WBC: 6.1 x10 3/mm (ref 3.8–10.6)

## 2011-11-23 LAB — HEPATIC FUNCTION PANEL A (ARMC)
Albumin: 3.8 g/dL (ref 3.4–5.0)
Bilirubin,Total: 0.5 mg/dL (ref 0.2–1.0)
SGOT(AST): 20 U/L (ref 15–37)
SGPT (ALT): 26 U/L (ref 12–78)
Total Protein: 6.9 g/dL (ref 6.4–8.2)

## 2011-11-23 LAB — CREATININE, SERUM
Creatinine: 1.45 mg/dL — ABNORMAL HIGH (ref 0.60–1.30)
EGFR (African American): 59 — ABNORMAL LOW
EGFR (Non-African Amer.): 51 — ABNORMAL LOW

## 2011-11-29 LAB — CBC CANCER CENTER
Basophil %: 0.3 %
Eosinophil #: 0 x10 3/mm (ref 0.0–0.7)
Eosinophil %: 0.7 %
HCT: 43.1 % (ref 40.0–52.0)
HGB: 14.3 g/dL (ref 13.0–18.0)
Lymphocyte %: 29.1 %
MCH: 30.7 pg (ref 26.0–34.0)
MCHC: 33.2 g/dL (ref 32.0–36.0)
Monocyte %: 8.3 %
Neutrophil #: 4 x10 3/mm (ref 1.4–6.5)
Neutrophil %: 61.6 %
RDW: 13.7 % (ref 11.5–14.5)

## 2011-11-29 LAB — CREATININE, SERUM
Creatinine: 1.38 mg/dL — ABNORMAL HIGH (ref 0.60–1.30)
EGFR (African American): >60
EGFR (Non-African Amer.): 54 — ABNORMAL LOW

## 2011-12-13 LAB — CBC CANCER CENTER
Basophil %: 0.6 %
Eosinophil #: 0.1 x10 3/mm (ref 0.0–0.7)
Eosinophil %: 1.1 %
MCHC: 33.3 g/dL (ref 32.0–36.0)
MCV: 92 fL (ref 80–100)
Monocyte #: 0.5 x10 3/mm (ref 0.2–1.0)
Monocyte %: 10.2 %
Neutrophil #: 2.6 x10 3/mm (ref 1.4–6.5)
Neutrophil %: 48.1 %
RBC: 4.83 10*6/uL (ref 4.40–5.90)

## 2011-12-13 LAB — CREATININE, SERUM
Creatinine: 1.31 mg/dL — ABNORMAL HIGH (ref 0.60–1.30)
EGFR (African American): >60
EGFR (Non-African Amer.): 57 — ABNORMAL LOW

## 2011-12-13 LAB — URIC ACID: Uric Acid: 5.2 mg/dL (ref 3.5–7.2)

## 2011-12-14 LAB — KAPPA/LAMBDA FREE LIGHT CHAINS (ARMC)

## 2011-12-21 ENCOUNTER — Ambulatory Visit: Payer: Self-pay | Admitting: Internal Medicine

## 2011-12-30 LAB — COMPREHENSIVE METABOLIC PANEL
Albumin: 3.8 g/dL (ref 3.4–5.0)
Alkaline Phosphatase: 56 U/L (ref 50–136)
Anion Gap: 9 (ref 7–16)
BUN: 15 mg/dL (ref 7–18)
Calcium, Total: 9 mg/dL (ref 8.5–10.1)
Chloride: 105 mmol/L (ref 98–107)
EGFR (African American): >60
EGFR (Non-African Amer.): >60
Glucose: 114 mg/dL — ABNORMAL HIGH (ref 65–99)
Osmolality: 285 (ref 275–301)
Potassium: 3.6 mmol/L (ref 3.5–5.1)
Sodium: 142 mmol/L (ref 136–145)
Total Protein: 6.9 g/dL (ref 6.4–8.2)

## 2011-12-30 LAB — CBC CANCER CENTER
Basophil #: 0 x10 3/mm (ref 0.0–0.1)
Eosinophil #: 0.1 x10 3/mm (ref 0.0–0.7)
Eosinophil %: 0.9 %
HGB: 14.6 g/dL (ref 13.0–18.0)
MCH: 30.6 pg (ref 26.0–34.0)
MCHC: 33.2 g/dL (ref 32.0–36.0)
Monocyte #: 0.5 x10 3/mm (ref 0.2–1.0)
Neutrophil %: 52.3 %
Platelet: 214 x10 3/mm (ref 150–440)
RDW: 13.3 % (ref 11.5–14.5)

## 2011-12-31 LAB — KAPPA/LAMBDA FREE LIGHT CHAINS (ARMC)

## 2012-01-19 LAB — CREATININE, SERUM
Creatinine: 1.13 mg/dL (ref 0.60–1.30)
EGFR (African American): >60

## 2012-01-21 ENCOUNTER — Ambulatory Visit: Payer: Self-pay | Admitting: Internal Medicine

## 2012-01-27 ENCOUNTER — Ambulatory Visit: Payer: Self-pay | Admitting: Surgery

## 2012-01-27 DIAGNOSIS — Z01810 Encounter for preprocedural cardiovascular examination: Secondary | ICD-10-CM

## 2012-01-28 ENCOUNTER — Ambulatory Visit: Payer: Self-pay | Admitting: Surgery

## 2012-02-02 LAB — CBC CANCER CENTER
Eosinophil #: 0.1 x10 3/mm (ref 0.0–0.7)
Eosinophil %: 1 %
Lymphocyte %: 32.3 %
Monocyte #: 0.4 x10 3/mm (ref 0.2–1.0)
Monocyte %: 5.3 %
Neutrophil %: 60.8 %
Platelet: 240 x10 3/mm (ref 150–440)
RBC: 5.02 10*6/uL (ref 4.40–5.90)
RDW: 12.8 % (ref 11.5–14.5)
WBC: 7.2 x10 3/mm (ref 3.8–10.6)

## 2012-02-02 LAB — CREATININE, SERUM
Creatinine: 1.27 mg/dL (ref 0.60–1.30)
EGFR (African American): >60
EGFR (Non-African Amer.): 59 — ABNORMAL LOW

## 2012-02-14 LAB — CBC CANCER CENTER
Basophil #: 0 x10 3/mm (ref 0.0–0.1)
HGB: 15.7 g/dL (ref 13.0–18.0)
Lymphocyte #: 2.4 x10 3/mm (ref 1.0–3.6)
Lymphocyte %: 32.5 %
MCHC: 33.1 g/dL (ref 32.0–36.0)
MCV: 92 fL (ref 80–100)
Monocyte #: 0.4 x10 3/mm (ref 0.2–1.0)
Monocyte %: 5 %
Neutrophil #: 4.5 x10 3/mm (ref 1.4–6.5)
Neutrophil %: 60.3 %
RDW: 12.8 % (ref 11.5–14.5)

## 2012-02-14 LAB — URIC ACID: Uric Acid: 4.9 mg/dL (ref 3.5–7.2)

## 2012-02-14 LAB — CREATININE, SERUM
Creatinine: 1.23 mg/dL (ref 0.60–1.30)
EGFR (African American): >60

## 2012-02-15 LAB — KAPPA/LAMBDA FREE LIGHT CHAINS (ARMC)

## 2012-02-20 ENCOUNTER — Ambulatory Visit: Payer: Self-pay | Admitting: Internal Medicine

## 2012-02-24 LAB — CBC CANCER CENTER
Basophil #: 0 x10 3/mm (ref 0.0–0.1)
Eosinophil #: 0.1 x10 3/mm (ref 0.0–0.7)
Eosinophil %: 1 %
Lymphocyte %: 33.4 %
MCH: 30.8 pg (ref 26.0–34.0)
MCHC: 34.7 g/dL (ref 32.0–36.0)
MCV: 89 fL (ref 80–100)
Monocyte #: 0.6 x10 3/mm (ref 0.2–1.0)
Neutrophil %: 56.9 %
Platelet: 202 x10 3/mm (ref 150–440)
RBC: 5.07 10*6/uL (ref 4.40–5.90)
WBC: 7.3 x10 3/mm (ref 3.8–10.6)

## 2012-02-24 LAB — CREATININE, SERUM
Creatinine: 1.27 mg/dL (ref 0.60–1.30)
EGFR (African American): >60
EGFR (Non-African Amer.): 59 — ABNORMAL LOW

## 2012-02-28 LAB — CBC CANCER CENTER
Basophil #: 0.1 x10 3/mm (ref 0.0–0.1)
Eosinophil #: 0.1 x10 3/mm (ref 0.0–0.7)
Eosinophil %: 0.7 %
Lymphocyte #: 2.7 x10 3/mm (ref 1.0–3.6)
Lymphocyte %: 33.7 %
MCHC: 34.6 g/dL (ref 32.0–36.0)
MCV: 89 fL (ref 80–100)
Monocyte %: 9.5 %
Neutrophil #: 4.5 x10 3/mm (ref 1.4–6.5)
Platelet: 168 x10 3/mm (ref 150–440)
RDW: 12.8 % (ref 11.5–14.5)
WBC: 8.1 x10 3/mm (ref 3.8–10.6)

## 2012-03-02 LAB — CBC CANCER CENTER
Basophil #: 0 x10 3/mm (ref 0.0–0.1)
Eosinophil %: 0.7 %
HGB: 16.2 g/dL (ref 13.0–18.0)
Lymphocyte #: 1.4 x10 3/mm (ref 1.0–3.6)
Lymphocyte %: 27.4 %
MCH: 30.9 pg (ref 26.0–34.0)
MCV: 90 fL (ref 80–100)
Monocyte %: 5.6 %
Neutrophil #: 3.4 x10 3/mm (ref 1.4–6.5)
Neutrophil %: 65.8 %
RBC: 5.23 10*6/uL (ref 4.40–5.90)
RDW: 12.9 % (ref 11.5–14.5)

## 2012-03-02 LAB — CREATININE, SERUM
Creatinine: 1.29 mg/dL (ref 0.60–1.30)
EGFR (Non-African Amer.): 58 — ABNORMAL LOW

## 2012-03-02 LAB — CALCIUM: Calcium, Total: 9.5 mg/dL (ref 8.5–10.1)

## 2012-03-06 LAB — CBC CANCER CENTER
Basophil #: 0 x10 3/mm (ref 0.0–0.1)
Basophil %: 0.2 %
HCT: 41.8 % (ref 40.0–52.0)
HGB: 14.4 g/dL (ref 13.0–18.0)
Lymphocyte %: 31.8 %
Monocyte %: 12.9 %
Neutrophil #: 2.8 x10 3/mm (ref 1.4–6.5)
Neutrophil %: 54.9 %
Platelet: 86 x10 3/mm — ABNORMAL LOW (ref 150–440)
RBC: 4.73 10*6/uL (ref 4.40–5.90)
RDW: 12.6 % (ref 11.5–14.5)
WBC: 5.2 x10 3/mm (ref 3.8–10.6)

## 2012-03-13 LAB — CBC CANCER CENTER
Basophil #: 0 x10 3/mm (ref 0.0–0.1)
Eosinophil #: 0 x10 3/mm (ref 0.0–0.7)
Eosinophil %: 0.7 %
Lymphocyte #: 2.4 x10 3/mm (ref 1.0–3.6)
Lymphocyte %: 33.8 %
MCH: 30.7 pg (ref 26.0–34.0)
MCHC: 34.5 g/dL (ref 32.0–36.0)
MCV: 89 fL (ref 80–100)
Monocyte #: 0.4 x10 3/mm (ref 0.2–1.0)
Neutrophil #: 4.2 x10 3/mm (ref 1.4–6.5)
Neutrophil %: 59.4 %
Platelet: 249 x10 3/mm (ref 150–440)
RBC: 4.75 10*6/uL (ref 4.40–5.90)
RDW: 13 % (ref 11.5–14.5)

## 2012-03-13 LAB — CREATININE, SERUM: EGFR (Non-African Amer.): >60

## 2012-03-16 LAB — CBC CANCER CENTER
Basophil #: 0.1 x10 3/mm (ref 0.0–0.1)
Basophil %: 0.9 %
Eosinophil #: 0 x10 3/mm (ref 0.0–0.7)
HGB: 15.3 g/dL (ref 13.0–18.0)
Lymphocyte #: 2.8 x10 3/mm (ref 1.0–3.6)
Lymphocyte %: 30.4 %
MCH: 30.2 pg (ref 26.0–34.0)
MCHC: 33.7 g/dL (ref 32.0–36.0)
MCV: 90 fL (ref 80–100)
Neutrophil %: 58.5 %
Platelet: 255 x10 3/mm (ref 150–440)

## 2012-03-16 LAB — CREATININE, SERUM
Creatinine: 1.14 mg/dL (ref 0.60–1.30)
EGFR (Non-African Amer.): >60

## 2012-03-20 LAB — CREATININE, SERUM: EGFR (Non-African Amer.): >60

## 2012-03-20 LAB — CBC CANCER CENTER
Basophil #: 0 "x10 3/mm "
Basophil %: 0.4 %
Eosinophil #: 0 "x10 3/mm "
Eosinophil %: 0.4 %
HCT: 43.9 %
HGB: 15.1 g/dL
Lymphocyte %: 23.6 %
Lymphs Abs: 2.5 "x10 3/mm "
MCH: 30.6 pg
MCHC: 34.3 g/dL
MCV: 89 fL
Monocyte #: 1 "x10 3/mm "
Monocyte %: 9.2 %
Neutrophil #: 6.9 "x10 3/mm " — ABNORMAL HIGH
Neutrophil %: 66.4 %
Platelet: 174 "x10 3/mm "
RBC: 4.93 "x10 6/mm "
RDW: 13.1 %
WBC: 10.4 "x10 3/mm "

## 2012-03-22 ENCOUNTER — Ambulatory Visit: Payer: Self-pay | Admitting: Internal Medicine

## 2012-03-23 LAB — CBC CANCER CENTER
Basophil #: 0 x10 3/mm (ref 0.0–0.1)
Basophil %: 0.2 %
Eosinophil #: 0 x10 3/mm (ref 0.0–0.7)
Eosinophil %: 0.5 %
HCT: 44.2 % (ref 40.0–52.0)
HGB: 15.1 g/dL (ref 13.0–18.0)
Lymphocyte #: 2.2 x10 3/mm (ref 1.0–3.6)
MCH: 30.4 pg (ref 26.0–34.0)
MCHC: 34.2 g/dL (ref 32.0–36.0)
Neutrophil #: 3.4 x10 3/mm (ref 1.4–6.5)
Neutrophil %: 52.5 %
RBC: 4.96 10*6/uL (ref 4.40–5.90)
RDW: 13.2 % (ref 11.5–14.5)
WBC: 6.5 x10 3/mm (ref 3.8–10.6)

## 2012-03-23 LAB — CREATININE, SERUM
EGFR (African American): >60
EGFR (Non-African Amer.): >60

## 2012-03-27 LAB — CBC CANCER CENTER
Basophil %: 0.3 %
Eosinophil #: 0 x10 3/mm (ref 0.0–0.7)
HCT: 40.8 % (ref 40.0–52.0)
HGB: 13.8 g/dL (ref 13.0–18.0)
Lymphocyte #: 1.8 x10 3/mm (ref 1.0–3.6)
Lymphocyte %: 32.4 %
MCH: 30.2 pg (ref 26.0–34.0)
MCHC: 33.9 g/dL (ref 32.0–36.0)
Monocyte #: 0.7 x10 3/mm (ref 0.2–1.0)
Monocyte %: 12.7 %
Neutrophil #: 3 x10 3/mm (ref 1.4–6.5)

## 2012-03-27 LAB — CREATININE, SERUM: Creatinine: 1.22 mg/dL (ref 0.60–1.30)

## 2012-04-03 LAB — CBC CANCER CENTER
Basophil #: 0 x10 3/mm (ref 0.0–0.1)
Basophil %: 0.5 %
HCT: 41 % (ref 40.0–52.0)
HGB: 14.1 g/dL (ref 13.0–18.0)
Lymphocyte #: 1.9 x10 3/mm (ref 1.0–3.6)
Lymphocyte %: 31.5 %
MCHC: 34.3 g/dL (ref 32.0–36.0)
MCV: 90 fL (ref 80–100)
Monocyte #: 0.8 x10 3/mm (ref 0.2–1.0)
Monocyte %: 12.3 %
Neutrophil #: 3.4 x10 3/mm (ref 1.4–6.5)
Platelet: 222 x10 3/mm (ref 150–440)
RDW: 13.8 % (ref 11.5–14.5)
WBC: 6.2 x10 3/mm (ref 3.8–10.6)

## 2012-04-03 LAB — CREATININE, SERUM
Creatinine: 1.07 mg/dL (ref 0.60–1.30)
EGFR (Non-African Amer.): >60

## 2012-04-06 LAB — CBC CANCER CENTER
Basophil #: 0.1 x10 3/mm (ref 0.0–0.1)
Basophil %: 0.9 %
Eosinophil #: 0 x10 3/mm (ref 0.0–0.7)
Eosinophil %: 0.5 %
HGB: 14.1 g/dL (ref 13.0–18.0)
Lymphocyte %: 23.6 %
Monocyte #: 0.6 x10 3/mm (ref 0.2–1.0)
Monocyte %: 8.1 %
Neutrophil %: 66.9 %
Platelet: 237 x10 3/mm (ref 150–440)
RDW: 14.1 % (ref 11.5–14.5)
WBC: 6.9 x10 3/mm (ref 3.8–10.6)

## 2012-04-10 LAB — CBC CANCER CENTER
Basophil %: 0.3 %
Eosinophil #: 0 x10 3/mm (ref 0.0–0.7)
HCT: 39.7 % — ABNORMAL LOW (ref 40.0–52.0)
HGB: 13.7 g/dL (ref 13.0–18.0)
Lymphocyte #: 2.3 x10 3/mm (ref 1.0–3.6)
Lymphocyte %: 37.4 %
MCH: 30.6 pg (ref 26.0–34.0)
MCHC: 34.6 g/dL (ref 32.0–36.0)
MCV: 89 fL (ref 80–100)
Monocyte #: 0.7 x10 3/mm (ref 0.2–1.0)
Monocyte %: 11.6 %
Neutrophil #: 3.1 x10 3/mm (ref 1.4–6.5)
Platelet: 160 x10 3/mm (ref 150–440)
RBC: 4.49 10*6/uL (ref 4.40–5.90)
RDW: 13.7 % (ref 11.5–14.5)

## 2012-04-10 LAB — CREATININE, SERUM
Creatinine: 1.19 mg/dL (ref 0.60–1.30)
EGFR (African American): >60
EGFR (Non-African Amer.): >60

## 2012-04-13 LAB — CBC CANCER CENTER
Basophil #: 0 x10 3/mm (ref 0.0–0.1)
Basophil %: 0.7 %
Eosinophil #: 0 x10 3/mm (ref 0.0–0.7)
Eosinophil %: 0.4 %
HGB: 13.8 g/dL (ref 13.0–18.0)
Lymphocyte %: 29.5 %
MCH: 30.2 pg (ref 26.0–34.0)
MCV: 89 fL (ref 80–100)
Monocyte %: 11.1 %
Platelet: 102 x10 3/mm — ABNORMAL LOW (ref 150–440)
RDW: 13.9 % (ref 11.5–14.5)
WBC: 5.9 x10 3/mm (ref 3.8–10.6)

## 2012-04-19 LAB — PROT IMMUNOELECTROPHORES(ARMC)

## 2012-04-19 LAB — KAPPA/LAMBDA FREE LIGHT CHAINS (ARMC)

## 2012-04-22 ENCOUNTER — Ambulatory Visit: Payer: Self-pay | Admitting: Internal Medicine

## 2012-04-24 LAB — CBC CANCER CENTER
Basophil #: 0 x10 3/mm (ref 0.0–0.1)
Eosinophil #: 0 x10 3/mm (ref 0.0–0.7)
Eosinophil %: 0.7 %
HCT: 39.5 % — ABNORMAL LOW (ref 40.0–52.0)
Lymphocyte %: 27 %
MCHC: 34.5 g/dL (ref 32.0–36.0)
Monocyte #: 0.7 x10 3/mm (ref 0.2–1.0)
Neutrophil #: 4.2 x10 3/mm (ref 1.4–6.5)
Neutrophil %: 61.8 %
Platelet: 178 x10 3/mm (ref 150–440)
RBC: 4.4 10*6/uL (ref 4.40–5.90)
WBC: 6.8 x10 3/mm (ref 3.8–10.6)

## 2012-04-24 LAB — COMPREHENSIVE METABOLIC PANEL
Albumin: 3.7 g/dL (ref 3.4–5.0)
Anion Gap: 6 — ABNORMAL LOW (ref 7–16)
BUN: 17 mg/dL (ref 7–18)
Bilirubin,Total: 0.7 mg/dL (ref 0.2–1.0)
Co2: 31 mmol/L (ref 21–32)
EGFR (African American): >60
Potassium: 4.4 mmol/L (ref 3.5–5.1)
SGPT (ALT): 24 U/L (ref 12–78)
Total Protein: 6.8 g/dL (ref 6.4–8.2)

## 2012-05-01 LAB — CBC CANCER CENTER
Basophil %: 0.7 %
Eosinophil #: 0 x10 3/mm (ref 0.0–0.7)
Eosinophil %: 0.7 %
HCT: 38.6 % — ABNORMAL LOW (ref 40.0–52.0)
Lymphocyte #: 2 x10 3/mm (ref 1.0–3.6)
MCV: 90 fL (ref 80–100)
Platelet: 242 x10 3/mm (ref 150–440)
RDW: 14.3 % (ref 11.5–14.5)

## 2012-05-15 LAB — CBC CANCER CENTER
Basophil #: 0 x10 3/mm (ref 0.0–0.1)
Eosinophil #: 0.1 x10 3/mm (ref 0.0–0.7)
HGB: 13.8 g/dL (ref 13.0–18.0)
Lymphocyte #: 1.9 x10 3/mm (ref 1.0–3.6)
Lymphocyte %: 31.1 %
MCH: 30.9 pg (ref 26.0–34.0)
MCHC: 33.9 g/dL (ref 32.0–36.0)
MCV: 91 fL (ref 80–100)
Monocyte #: 0.5 x10 3/mm (ref 0.2–1.0)
Monocyte %: 9.1 %
Neutrophil %: 58.3 %
Platelet: 183 x10 3/mm (ref 150–440)

## 2012-05-15 LAB — CREATININE, SERUM
Creatinine: 1.04 mg/dL (ref 0.60–1.30)
EGFR (Non-African Amer.): >60

## 2012-05-15 LAB — CALCIUM: Calcium, Total: 8.9 mg/dL (ref 8.5–10.1)

## 2012-05-16 LAB — KAPPA/LAMBDA FREE LIGHT CHAINS (ARMC)

## 2012-05-20 ENCOUNTER — Ambulatory Visit: Payer: Self-pay | Admitting: Internal Medicine

## 2012-05-29 LAB — CBC CANCER CENTER
Basophil #: 0.1 x10 3/mm (ref 0.0–0.1)
Basophil %: 0.7 %
Eosinophil %: 1.6 %
HGB: 14.4 g/dL (ref 13.0–18.0)
Lymphocyte %: 27.1 %
MCH: 30.5 pg (ref 26.0–34.0)
Neutrophil %: 55.1 %
Platelet: 231 x10 3/mm (ref 150–440)
RBC: 4.73 10*6/uL (ref 4.40–5.90)
RDW: 13.9 % (ref 11.5–14.5)

## 2012-05-29 LAB — CREATININE, SERUM
Creatinine: 1.25 mg/dL (ref 0.60–1.30)
EGFR (Non-African Amer.): >60

## 2012-06-12 LAB — CBC CANCER CENTER
Basophil #: 0 x10 3/mm (ref 0.0–0.1)
HGB: 14.5 g/dL (ref 13.0–18.0)
Lymphocyte #: 1.9 x10 3/mm (ref 1.0–3.6)
Lymphocyte %: 44.2 %
MCH: 31.1 pg (ref 26.0–34.0)
MCV: 89 fL (ref 80–100)
RDW: 13.8 % (ref 11.5–14.5)

## 2012-06-12 LAB — COMPREHENSIVE METABOLIC PANEL
Albumin: 3.6 g/dL (ref 3.4–5.0)
Alkaline Phosphatase: 74 U/L (ref 50–136)
Anion Gap: 5 — ABNORMAL LOW (ref 7–16)
Calcium, Total: 9 mg/dL (ref 8.5–10.1)
Chloride: 104 mmol/L (ref 98–107)
Creatinine: 1.13 mg/dL (ref 0.60–1.30)
EGFR (African American): >60
EGFR (Non-African Amer.): >60
Glucose: 114 mg/dL — ABNORMAL HIGH (ref 65–99)
Potassium: 4.2 mmol/L (ref 3.5–5.1)
Sodium: 142 mmol/L (ref 136–145)
Total Protein: 7 g/dL (ref 6.4–8.2)

## 2012-06-13 LAB — KAPPA/LAMBDA FREE LIGHT CHAINS (ARMC)

## 2012-06-20 ENCOUNTER — Ambulatory Visit: Payer: Self-pay | Admitting: Internal Medicine

## 2012-06-26 LAB — CBC CANCER CENTER
Basophil %: 0.3 %
Eosinophil %: 1.2 %
HGB: 14.5 g/dL (ref 13.0–18.0)
Lymphocyte #: 2.2 x10 3/mm (ref 1.0–3.6)
Lymphocyte %: 26.3 %
MCHC: 33.4 g/dL (ref 32.0–36.0)
Monocyte %: 16 %
Neutrophil #: 4.8 x10 3/mm (ref 1.4–6.5)
Neutrophil %: 56.2 %
Platelet: 284 x10 3/mm (ref 150–440)
RBC: 4.81 10*6/uL (ref 4.40–5.90)

## 2012-06-26 LAB — CREATININE, SERUM
Creatinine: 1.22 mg/dL (ref 0.60–1.30)
EGFR (African American): >60

## 2012-07-03 LAB — CREATININE, SERUM
Creatinine: 1.22 mg/dL (ref 0.60–1.30)
EGFR (African American): >60

## 2012-07-03 LAB — CBC CANCER CENTER
Basophil #: 0.1 x10 3/mm (ref 0.0–0.1)
Eosinophil %: 3 %
HCT: 41.6 % (ref 40.0–52.0)
HGB: 14.1 g/dL (ref 13.0–18.0)
Lymphocyte #: 1.9 x10 3/mm (ref 1.0–3.6)
MCH: 30.6 pg (ref 26.0–34.0)
MCHC: 34 g/dL (ref 32.0–36.0)
MCV: 90 fL (ref 80–100)
RBC: 4.62 10*6/uL (ref 4.40–5.90)
RDW: 13.8 % (ref 11.5–14.5)

## 2012-07-10 LAB — CBC CANCER CENTER
Basophil #: 0.1 x10 3/mm (ref 0.0–0.1)
Eosinophil #: 0 x10 3/mm (ref 0.0–0.7)
Eosinophil %: 0.6 %
HGB: 14.4 g/dL (ref 13.0–18.0)
Lymphocyte %: 45.3 %
MCH: 30.9 pg (ref 26.0–34.0)
MCHC: 34.3 g/dL (ref 32.0–36.0)
MCV: 90 fL (ref 80–100)
Neutrophil #: 1.6 x10 3/mm (ref 1.4–6.5)
Neutrophil %: 39.4 %
WBC: 4.1 x10 3/mm (ref 3.8–10.6)

## 2012-07-10 LAB — COMPREHENSIVE METABOLIC PANEL
Albumin: 3.6 g/dL (ref 3.4–5.0)
BUN: 15 mg/dL (ref 7–18)
Calcium, Total: 9 mg/dL (ref 8.5–10.1)
Chloride: 106 mmol/L (ref 98–107)
Co2: 31 mmol/L (ref 21–32)
EGFR (African American): >60
Potassium: 4.2 mmol/L (ref 3.5–5.1)
SGPT (ALT): 25 U/L (ref 12–78)
Sodium: 140 mmol/L (ref 136–145)
Total Protein: 7.1 g/dL (ref 6.4–8.2)

## 2012-07-10 LAB — URIC ACID: Uric Acid: 4 mg/dL (ref 3.5–7.2)

## 2012-07-20 ENCOUNTER — Ambulatory Visit: Payer: Self-pay | Admitting: Internal Medicine

## 2012-07-25 LAB — CBC CANCER CENTER
Basophil #: 0 "x10 3/mm "
Basophil %: 0.3 %
Eosinophil #: 0.2 "x10 3/mm "
Eosinophil %: 4 %
HCT: 43.3 %
HGB: 14.8 g/dL
Lymphocyte %: 36 %
Lymphs Abs: 2 "x10 3/mm "
MCH: 30.9 pg
MCHC: 34.2 g/dL
MCV: 90 fL
Monocyte #: 0.9 "x10 3/mm "
Monocyte %: 15.6 %
Neutrophil #: 2.5 "x10 3/mm "
Neutrophil %: 44.1 %
Platelet: 255 "x10 3/mm "
RBC: 4.8 "x10 6/mm "
RDW: 14.1 %
WBC: 5.6 "x10 3/mm "

## 2012-08-07 LAB — CBC CANCER CENTER
Eosinophil %: 0.9 %
HCT: 44.8 % (ref 40.0–52.0)
HGB: 15.3 g/dL (ref 13.0–18.0)
Lymphocyte #: 1.9 x10 3/mm (ref 1.0–3.6)
MCH: 30.6 pg (ref 26.0–34.0)
MCHC: 34.1 g/dL (ref 32.0–36.0)
MCV: 90 fL (ref 80–100)
Monocyte #: 0.6 x10 3/mm (ref 0.2–1.0)
Platelet: 233 x10 3/mm (ref 150–440)
WBC: 4.5 x10 3/mm (ref 3.8–10.6)

## 2012-08-07 LAB — CREATININE, SERUM
Creatinine: 1.2 mg/dL (ref 0.60–1.30)
EGFR (African American): >60

## 2012-08-07 LAB — MAGNESIUM: Magnesium: 1.9 mg/dL

## 2012-08-08 LAB — KAPPA/LAMBDA FREE LIGHT CHAINS (ARMC)

## 2012-08-20 ENCOUNTER — Ambulatory Visit: Payer: Self-pay | Admitting: Internal Medicine

## 2012-08-21 LAB — CBC CANCER CENTER
Basophil #: 0 x10 3/mm (ref 0.0–0.1)
Basophil %: 0.4 %
HCT: 41.9 % (ref 40.0–52.0)
Lymphocyte #: 2.1 x10 3/mm (ref 1.0–3.6)
Lymphocyte %: 33 %
MCV: 90 fL (ref 80–100)
Monocyte #: 1.2 x10 3/mm — ABNORMAL HIGH (ref 0.2–1.0)
Monocyte %: 18.1 %
Neutrophil #: 2.9 x10 3/mm (ref 1.4–6.5)
Platelet: 193 x10 3/mm (ref 150–440)
RBC: 4.66 10*6/uL (ref 4.40–5.90)
RDW: 14.8 % — ABNORMAL HIGH (ref 11.5–14.5)
WBC: 6.4 x10 3/mm (ref 3.8–10.6)

## 2012-08-21 LAB — MAGNESIUM: Magnesium: 1.9 mg/dL

## 2012-08-21 LAB — CREATININE, SERUM: EGFR (African American): >60

## 2012-09-04 LAB — CBC CANCER CENTER
Eosinophil #: 0.1 x10 3/mm (ref 0.0–0.7)
Eosinophil %: 1.1 %
HCT: 41.1 % (ref 40.0–52.0)
HGB: 14.4 g/dL (ref 13.0–18.0)
Lymphocyte %: 39.2 %
MCHC: 35.1 g/dL (ref 32.0–36.0)
MCV: 91 fL (ref 80–100)
Monocyte #: 0.6 x10 3/mm (ref 0.2–1.0)
Neutrophil #: 2 x10 3/mm (ref 1.4–6.5)
Neutrophil %: 44.6 %
Platelet: 178 x10 3/mm (ref 150–440)
RBC: 4.54 10*6/uL (ref 4.40–5.90)
RDW: 15.8 % — ABNORMAL HIGH (ref 11.5–14.5)
WBC: 4.6 x10 3/mm (ref 3.8–10.6)

## 2012-09-04 LAB — CREATININE, SERUM
Creatinine: 1.27 mg/dL (ref 0.60–1.30)
EGFR (African American): >60
EGFR (Non-African Amer.): 59 — ABNORMAL LOW

## 2012-09-04 LAB — MAGNESIUM: Magnesium: 1.7 mg/dL — ABNORMAL LOW

## 2012-09-18 LAB — CBC CANCER CENTER
Eosinophil #: 0.2 x10 3/mm (ref 0.0–0.7)
HGB: 14.3 g/dL (ref 13.0–18.0)
Lymphocyte %: 38.8 %
MCHC: 34.4 g/dL (ref 32.0–36.0)
Monocyte #: 1 x10 3/mm (ref 0.2–1.0)
Monocyte %: 16.8 %
Neutrophil %: 39.9 %
RDW: 15.7 % — ABNORMAL HIGH (ref 11.5–14.5)
WBC: 5.8 x10 3/mm (ref 3.8–10.6)

## 2012-09-18 LAB — MAGNESIUM: Magnesium: 1.9 mg/dL

## 2012-09-18 LAB — POTASSIUM: Potassium: 3.8 mmol/L (ref 3.5–5.1)

## 2012-09-18 LAB — CREATININE, SERUM: Creatinine: 1.3 mg/dL (ref 0.60–1.30)

## 2012-09-19 ENCOUNTER — Ambulatory Visit: Payer: Self-pay | Admitting: Internal Medicine

## 2012-09-26 LAB — KAPPA/LAMBDA FREE LIGHT CHAINS (ARMC)

## 2012-10-06 LAB — CBC CANCER CENTER
Basophil #: 0 x10 3/mm (ref 0.0–0.1)
Eosinophil #: 0.1 x10 3/mm (ref 0.0–0.7)
Eosinophil %: 3 %
HCT: 42.1 % (ref 40.0–52.0)
Lymphocyte %: 33.2 %
MCHC: 34.8 g/dL (ref 32.0–36.0)
MCV: 91 fL (ref 80–100)
Monocyte #: 0.8 x10 3/mm (ref 0.2–1.0)
Monocyte %: 15.9 %
Neutrophil %: 47.7 %
Platelet: 273 x10 3/mm (ref 150–440)
RBC: 4.62 10*6/uL (ref 4.40–5.90)
WBC: 4.9 x10 3/mm (ref 3.8–10.6)

## 2012-10-06 LAB — HEPATIC FUNCTION PANEL A (ARMC)
Bilirubin, Direct: 0.1 mg/dL (ref 0.00–0.20)
Bilirubin,Total: 0.3 mg/dL (ref 0.2–1.0)

## 2012-10-06 LAB — CREATININE, SERUM: EGFR (Non-African Amer.): >60

## 2012-10-06 LAB — MAGNESIUM: Magnesium: 1.9 mg/dL

## 2012-10-20 ENCOUNTER — Ambulatory Visit: Payer: Self-pay | Admitting: Internal Medicine

## 2012-10-23 LAB — CREATININE, SERUM
Creatinine: 1.29 mg/dL (ref 0.60–1.30)
EGFR (African American): >60
EGFR (Non-African Amer.): 58 — ABNORMAL LOW

## 2012-10-23 LAB — CBC CANCER CENTER
Basophil #: 0.1 x10 3/mm (ref 0.0–0.1)
Basophil %: 1.3 %
Eosinophil #: 0.2 x10 3/mm (ref 0.0–0.7)
Eosinophil %: 4.2 %
HCT: 42.4 % (ref 40.0–52.0)
HGB: 14.8 g/dL (ref 13.0–18.0)
Lymphocyte #: 1.5 x10 3/mm (ref 1.0–3.6)
MCH: 32.3 pg (ref 26.0–34.0)
MCV: 93 fL (ref 80–100)
Monocyte %: 16.2 %
Platelet: 135 x10 3/mm — ABNORMAL LOW (ref 150–440)
RBC: 4.58 10*6/uL (ref 4.40–5.90)
RDW: 16.9 % — ABNORMAL HIGH (ref 11.5–14.5)
WBC: 4.3 x10 3/mm (ref 3.8–10.6)

## 2012-11-06 LAB — CBC CANCER CENTER
Basophil #: 0 x10 3/mm (ref 0.0–0.1)
Basophil %: 0.7 %
Eosinophil #: 0.1 x10 3/mm (ref 0.0–0.7)
Eosinophil %: 2.1 %
HGB: 14.7 g/dL (ref 13.0–18.0)
Lymphocyte %: 49.7 %
Neutrophil #: 1.4 x10 3/mm (ref 1.4–6.5)
WBC: 4 x10 3/mm (ref 3.8–10.6)

## 2012-11-06 LAB — CREATININE, SERUM: EGFR (African American): >60

## 2012-11-06 LAB — POTASSIUM: Potassium: 3.7 mmol/L (ref 3.5–5.1)

## 2012-11-20 ENCOUNTER — Ambulatory Visit: Payer: Self-pay | Admitting: Internal Medicine

## 2012-11-27 LAB — CBC CANCER CENTER
Basophil #: 0 x10 3/mm (ref 0.0–0.1)
Eosinophil %: 1.9 %
HGB: 14.2 g/dL (ref 13.0–18.0)
Lymphocyte #: 1.9 x10 3/mm (ref 1.0–3.6)
Lymphocyte %: 50.2 %
MCH: 32.4 pg (ref 26.0–34.0)
MCHC: 34.4 g/dL (ref 32.0–36.0)
MCV: 94 fL (ref 80–100)
Monocyte #: 0.4 x10 3/mm (ref 0.2–1.0)
Neutrophil %: 35.1 %
RDW: 16.4 % — ABNORMAL HIGH (ref 11.5–14.5)
WBC: 3.7 x10 3/mm — ABNORMAL LOW (ref 3.8–10.6)

## 2012-11-27 LAB — CREATININE, SERUM: Creatinine: 1.24 mg/dL (ref 0.60–1.30)

## 2012-12-11 LAB — CBC CANCER CENTER
Eosinophil #: 0.1 x10 3/mm (ref 0.0–0.7)
Eosinophil %: 3.7 %
Lymphocyte #: 1.7 x10 3/mm (ref 1.0–3.6)
Lymphocyte %: 44.5 %
MCH: 32.7 pg (ref 26.0–34.0)
Monocyte #: 0.6 x10 3/mm (ref 0.2–1.0)
Monocyte %: 14.3 %
Neutrophil %: 37.2 %
Platelet: 191 x10 3/mm (ref 150–440)
RBC: 4.32 10*6/uL — ABNORMAL LOW (ref 4.40–5.90)
RDW: 16.8 % — ABNORMAL HIGH (ref 11.5–14.5)
WBC: 3.9 x10 3/mm (ref 3.8–10.6)

## 2012-12-20 ENCOUNTER — Ambulatory Visit: Payer: Self-pay | Admitting: Internal Medicine

## 2012-12-21 LAB — CBC CANCER CENTER
Basophil #: 0 x10 3/mm (ref 0.0–0.1)
Basophil %: 0.9 %
Eosinophil #: 0.4 x10 3/mm (ref 0.0–0.7)
Eosinophil %: 9 %
HGB: 14.3 g/dL (ref 13.0–18.0)
Lymphocyte #: 1.6 x10 3/mm (ref 1.0–3.6)
MCHC: 33.9 g/dL (ref 32.0–36.0)
Monocyte #: 0.7 x10 3/mm (ref 0.2–1.0)
Neutrophil %: 34 %
Platelet: 140 x10 3/mm — ABNORMAL LOW (ref 150–440)
RBC: 4.38 10*6/uL — ABNORMAL LOW (ref 4.40–5.90)
RDW: 16.5 % — ABNORMAL HIGH (ref 11.5–14.5)
WBC: 4.1 x10 3/mm (ref 3.8–10.6)

## 2012-12-21 LAB — CREATININE, SERUM
Creatinine: 1.14 mg/dL (ref 0.60–1.30)
EGFR (African American): >60
EGFR (Non-African Amer.): >60

## 2012-12-22 LAB — KAPPA/LAMBDA FREE LIGHT CHAINS (ARMC)

## 2013-01-08 LAB — CBC CANCER CENTER
Basophil #: 0.1 x10 3/mm (ref 0.0–0.1)
Basophil %: 1.5 %
Eosinophil #: 0.1 x10 3/mm (ref 0.0–0.7)
HGB: 14.5 g/dL (ref 13.0–18.0)
Lymphocyte #: 1.7 x10 3/mm (ref 1.0–3.6)
Lymphocyte %: 40.1 %
MCH: 33.1 pg (ref 26.0–34.0)
Monocyte #: 0.5 x10 3/mm (ref 0.2–1.0)
Neutrophil #: 1.8 x10 3/mm (ref 1.4–6.5)
Platelet: 188 x10 3/mm (ref 150–440)
RBC: 4.37 10*6/uL — ABNORMAL LOW (ref 4.40–5.90)
RDW: 16 % — ABNORMAL HIGH (ref 11.5–14.5)
WBC: 4.2 x10 3/mm (ref 3.8–10.6)

## 2013-01-08 LAB — CREATININE, SERUM
Creatinine: 1.19 mg/dL (ref 0.60–1.30)
EGFR (Non-African Amer.): >60

## 2013-01-20 ENCOUNTER — Ambulatory Visit: Payer: Self-pay | Admitting: Internal Medicine

## 2013-01-22 LAB — CBC CANCER CENTER
Basophil %: 0.7 %
Eosinophil #: 0.1 x10 3/mm (ref 0.0–0.7)
Eosinophil %: 2.9 %
Lymphocyte %: 52 %
MCHC: 33.9 g/dL (ref 32.0–36.0)
MCV: 96 fL (ref 80–100)
Neutrophil %: 33.5 %
WBC: 3.5 x10 3/mm — ABNORMAL LOW (ref 3.8–10.6)

## 2013-01-22 LAB — CREATININE, SERUM
Creatinine: 1.14 mg/dL (ref 0.60–1.30)
EGFR (African American): >60

## 2013-01-23 LAB — PROT IMMUNOELECTROPHORES(ARMC)

## 2013-01-23 LAB — KAPPA/LAMBDA FREE LIGHT CHAINS (ARMC)

## 2013-02-05 LAB — CBC CANCER CENTER
Basophil %: 0.9 %
Eosinophil #: 0.1 x10 3/mm (ref 0.0–0.7)
Eosinophil %: 2.1 %
HGB: 15.2 g/dL (ref 13.0–18.0)
Lymphocyte #: 2.1 x10 3/mm (ref 1.0–3.6)
Lymphocyte %: 39 %
MCH: 32.5 pg (ref 26.0–34.0)
MCHC: 33 g/dL (ref 32.0–36.0)
MCV: 98 fL (ref 80–100)
Monocyte #: 0.9 x10 3/mm (ref 0.2–1.0)
Monocyte %: 15.9 %
Neutrophil #: 2.3 x10 3/mm (ref 1.4–6.5)
Neutrophil %: 42.1 %
RBC: 4.69 10*6/uL (ref 4.40–5.90)
RDW: 15.8 % — ABNORMAL HIGH (ref 11.5–14.5)

## 2013-02-05 LAB — CREATININE, SERUM: EGFR (Non-African Amer.): >60

## 2013-02-19 ENCOUNTER — Ambulatory Visit: Payer: Self-pay | Admitting: Internal Medicine

## 2013-02-19 LAB — CBC CANCER CENTER
Basophil #: 0 x10 3/mm (ref 0.0–0.1)
Lymphocyte %: 59.3 %
MCH: 33.3 pg (ref 26.0–34.0)
MCHC: 34 g/dL (ref 32.0–36.0)
Neutrophil #: 0.7 x10 3/mm — ABNORMAL LOW (ref 1.4–6.5)
Neutrophil %: 24 %
RBC: 4.29 10*6/uL — ABNORMAL LOW (ref 4.40–5.90)
RDW: 16 % — ABNORMAL HIGH (ref 11.5–14.5)

## 2013-02-19 LAB — CREATININE, SERUM
Creatinine: 1.18 mg/dL (ref 0.60–1.30)
EGFR (African American): >60

## 2013-02-19 LAB — CALCIUM: Calcium, Total: 8.5 mg/dL (ref 8.5–10.1)

## 2013-02-20 LAB — KAPPA/LAMBDA FREE LIGHT CHAINS (ARMC)

## 2013-02-20 LAB — PROT IMMUNOELECTROPHORES(ARMC)

## 2013-02-23 LAB — CBC CANCER CENTER
Basophil #: 0.1 x10 3/mm (ref 0.0–0.1)
Basophil %: 2.3 %
Eosinophil %: 3.7 %
HGB: 14 g/dL (ref 13.0–18.0)
Lymphocyte %: 45.2 %
MCH: 32.6 pg (ref 26.0–34.0)
MCHC: 33.3 g/dL (ref 32.0–36.0)
MCV: 98 fL (ref 80–100)
Monocyte %: 12.3 %
Neutrophil #: 1.5 x10 3/mm (ref 1.4–6.5)
Neutrophil %: 36.5 %
WBC: 4.2 x10 3/mm (ref 3.8–10.6)

## 2013-03-14 LAB — CBC CANCER CENTER
Basophil #: 0 x10 3/mm (ref 0.0–0.1)
Eosinophil #: 0.1 x10 3/mm (ref 0.0–0.7)
Eosinophil %: 2.1 %
HGB: 14 g/dL (ref 13.0–18.0)
Lymphocyte %: 49.7 %
MCHC: 33.3 g/dL (ref 32.0–36.0)
MCV: 99 fL (ref 80–100)
Monocyte %: 10.3 %
Neutrophil #: 1.5 x10 3/mm (ref 1.4–6.5)
RBC: 4.24 10*6/uL — ABNORMAL LOW (ref 4.40–5.90)

## 2013-03-14 LAB — CREATININE, SERUM
Creatinine: 1.17 mg/dL (ref 0.60–1.30)
EGFR (African American): >60
EGFR (Non-African Amer.): >60

## 2013-03-19 LAB — KAPPA/LAMBDA FREE LIGHT CHAINS (ARMC)

## 2013-03-19 LAB — PROT IMMUNOELECTROPHORES(ARMC)

## 2013-03-22 ENCOUNTER — Ambulatory Visit: Payer: Self-pay | Admitting: Internal Medicine

## 2013-03-28 LAB — COMPREHENSIVE METABOLIC PANEL
ALBUMIN: 3.6 g/dL (ref 3.4–5.0)
ALK PHOS: 63 U/L
Anion Gap: 7 (ref 7–16)
BUN: 17 mg/dL (ref 7–18)
Bilirubin,Total: 0.1 mg/dL — ABNORMAL LOW (ref 0.2–1.0)
CALCIUM: 8.9 mg/dL (ref 8.5–10.1)
CO2: 28 mmol/L (ref 21–32)
CREATININE: 1.18 mg/dL (ref 0.60–1.30)
Chloride: 107 mmol/L (ref 98–107)
EGFR (African American): >60
EGFR (Non-African Amer.): >60
Glucose: 130 mg/dL — ABNORMAL HIGH (ref 65–99)
Osmolality: 286 (ref 275–301)
Potassium: 3.9 mmol/L (ref 3.5–5.1)
SGOT(AST): 13 U/L — ABNORMAL LOW (ref 15–37)
SGPT (ALT): 21 U/L (ref 12–78)
Sodium: 142 mmol/L (ref 136–145)
TOTAL PROTEIN: 6.4 g/dL (ref 6.4–8.2)

## 2013-03-28 LAB — CBC CANCER CENTER
BASOS PCT: 0.3 %
Basophil #: 0 x10 3/mm (ref 0.0–0.1)
EOS ABS: 0 x10 3/mm (ref 0.0–0.7)
EOS PCT: 1.1 %
HCT: 41.2 % (ref 40.0–52.0)
HGB: 13.7 g/dL (ref 13.0–18.0)
Lymphocyte #: 1.9 x10 3/mm (ref 1.0–3.6)
Lymphocyte %: 57 %
MCH: 33 pg (ref 26.0–34.0)
MCHC: 33.1 g/dL (ref 32.0–36.0)
MCV: 100 fL (ref 80–100)
MONO ABS: 0.4 x10 3/mm (ref 0.2–1.0)
Monocyte %: 10.6 %
NEUTROS PCT: 31 %
Neutrophil #: 1 x10 3/mm — ABNORMAL LOW (ref 1.4–6.5)
Platelet: 167 x10 3/mm (ref 150–440)
RBC: 4.13 10*6/uL — ABNORMAL LOW (ref 4.40–5.90)
RDW: 15.3 % — AB (ref 11.5–14.5)
WBC: 3.4 x10 3/mm — ABNORMAL LOW (ref 3.8–10.6)

## 2013-04-02 LAB — CBC CANCER CENTER
Basophil #: 0 x10 3/mm (ref 0.0–0.1)
Basophil %: 0.3 %
Eosinophil #: 0 x10 3/mm (ref 0.0–0.7)
Eosinophil %: 0.2 %
HCT: 42.6 % (ref 40.0–52.0)
HGB: 14.3 g/dL (ref 13.0–18.0)
LYMPHS ABS: 0.9 x10 3/mm — AB (ref 1.0–3.6)
Lymphocyte %: 21.3 %
MCH: 33.2 pg (ref 26.0–34.0)
MCHC: 33.6 g/dL (ref 32.0–36.0)
MCV: 99 fL (ref 80–100)
Monocyte #: 0.2 x10 3/mm (ref 0.2–1.0)
Monocyte %: 3.7 %
Neutrophil #: 3.2 x10 3/mm (ref 1.4–6.5)
Neutrophil %: 74.5 %
Platelet: 160 x10 3/mm (ref 150–440)
RBC: 4.32 10*6/uL — ABNORMAL LOW (ref 4.40–5.90)
RDW: 15.1 % — ABNORMAL HIGH (ref 11.5–14.5)
WBC: 4.3 x10 3/mm (ref 3.8–10.6)

## 2013-04-02 LAB — CREATININE, SERUM
CREATININE: 1.22 mg/dL (ref 0.60–1.30)
EGFR (African American): >60
EGFR (Non-African Amer.): >60

## 2013-04-09 LAB — CBC CANCER CENTER
Basophil #: 0 x10 3/mm (ref 0.0–0.1)
Basophil %: 0.3 %
Eosinophil #: 0 x10 3/mm (ref 0.0–0.7)
Eosinophil %: 0.2 %
HCT: 43.2 % (ref 40.0–52.0)
HGB: 14.6 g/dL (ref 13.0–18.0)
Lymphocyte #: 1.2 x10 3/mm (ref 1.0–3.6)
Lymphocyte %: 30 %
MCH: 33.2 pg (ref 26.0–34.0)
MCHC: 33.9 g/dL (ref 32.0–36.0)
MCV: 98 fL (ref 80–100)
MONOS PCT: 3.5 %
Monocyte #: 0.1 x10 3/mm — ABNORMAL LOW (ref 0.2–1.0)
NEUTROS ABS: 2.8 x10 3/mm (ref 1.4–6.5)
Neutrophil %: 66 %
Platelet: 138 x10 3/mm — ABNORMAL LOW (ref 150–440)
RBC: 4.4 10*6/uL (ref 4.40–5.90)
RDW: 15.1 % — ABNORMAL HIGH (ref 11.5–14.5)
WBC: 4.2 x10 3/mm (ref 3.8–10.6)

## 2013-04-09 LAB — CREATININE, SERUM
Creatinine: 1.2 mg/dL (ref 0.60–1.30)
EGFR (African American): >60
EGFR (Non-African Amer.): >60

## 2013-04-16 LAB — CBC CANCER CENTER
BASOS PCT: 0.2 %
Basophil #: 0 x10 3/mm (ref 0.0–0.1)
Eosinophil #: 0 x10 3/mm (ref 0.0–0.7)
Eosinophil %: 0.5 %
HCT: 42.9 % (ref 40.0–52.0)
HGB: 14.4 g/dL (ref 13.0–18.0)
LYMPHS PCT: 50 %
Lymphocyte #: 2.3 x10 3/mm (ref 1.0–3.6)
MCH: 33.1 pg (ref 26.0–34.0)
MCHC: 33.5 g/dL (ref 32.0–36.0)
MCV: 99 fL (ref 80–100)
MONO ABS: 0.4 x10 3/mm (ref 0.2–1.0)
Monocyte %: 8.4 %
NEUTROS PCT: 40.9 %
Neutrophil #: 1.9 x10 3/mm (ref 1.4–6.5)
PLATELETS: 147 x10 3/mm — AB (ref 150–440)
RBC: 4.34 10*6/uL — ABNORMAL LOW (ref 4.40–5.90)
RDW: 14.7 % — ABNORMAL HIGH (ref 11.5–14.5)
WBC: 4.6 x10 3/mm (ref 3.8–10.6)

## 2013-04-16 LAB — CREATININE, SERUM
Creatinine: 1.16 mg/dL (ref 0.60–1.30)
EGFR (African American): >60

## 2013-04-22 ENCOUNTER — Ambulatory Visit: Payer: Self-pay | Admitting: Internal Medicine

## 2013-04-23 LAB — CBC CANCER CENTER
BASOS ABS: 0 x10 3/mm (ref 0.0–0.1)
Basophil %: 0.3 %
Eosinophil #: 0 x10 3/mm (ref 0.0–0.7)
Eosinophil %: 0.8 %
HCT: 44.8 % (ref 40.0–52.0)
HGB: 15 g/dL (ref 13.0–18.0)
LYMPHS ABS: 1.3 x10 3/mm (ref 1.0–3.6)
Lymphocyte %: 25.2 %
MCH: 33.2 pg (ref 26.0–34.0)
MCHC: 33.4 g/dL (ref 32.0–36.0)
MCV: 99 fL (ref 80–100)
MONOS PCT: 4.7 %
Monocyte #: 0.2 x10 3/mm (ref 0.2–1.0)
Neutrophil #: 3.6 x10 3/mm (ref 1.4–6.5)
Neutrophil %: 69 %
PLATELETS: 163 x10 3/mm (ref 150–440)
RBC: 4.51 10*6/uL (ref 4.40–5.90)
RDW: 14.9 % — ABNORMAL HIGH (ref 11.5–14.5)
WBC: 5.2 x10 3/mm (ref 3.8–10.6)

## 2013-04-30 LAB — CBC CANCER CENTER
Basophil #: 0 x10 3/mm (ref 0.0–0.1)
Basophil %: 0.4 %
Eosinophil #: 0 x10 3/mm (ref 0.0–0.7)
Eosinophil %: 1 %
HCT: 42.7 % (ref 40.0–52.0)
HGB: 14.5 g/dL (ref 13.0–18.0)
Lymphocyte #: 2.2 x10 3/mm (ref 1.0–3.6)
Lymphocyte %: 42.1 %
MCH: 33.4 pg (ref 26.0–34.0)
MCHC: 33.9 g/dL (ref 32.0–36.0)
MCV: 99 fL (ref 80–100)
Monocyte #: 0.5 x10 3/mm (ref 0.2–1.0)
Monocyte %: 9.9 %
Neutrophil #: 2.4 x10 3/mm (ref 1.4–6.5)
Neutrophil %: 46.6 %
Platelet: 165 x10 3/mm (ref 150–440)
RBC: 4.33 10*6/uL — ABNORMAL LOW (ref 4.40–5.90)
RDW: 14.2 % (ref 11.5–14.5)
WBC: 5.2 x10 3/mm (ref 3.8–10.6)

## 2013-04-30 LAB — HEPATIC FUNCTION PANEL A (ARMC)
ALT: 24 U/L (ref 12–78)
Albumin: 3.6 g/dL (ref 3.4–5.0)
Alkaline Phosphatase: 65 U/L
Bilirubin, Direct: 0.1 mg/dL (ref 0.00–0.20)
Bilirubin,Total: 0.3 mg/dL (ref 0.2–1.0)
SGOT(AST): 15 U/L (ref 15–37)
Total Protein: 6.7 g/dL (ref 6.4–8.2)

## 2013-04-30 LAB — CREATININE, SERUM
Creatinine: 1.18 mg/dL (ref 0.60–1.30)
EGFR (Non-African Amer.): >60

## 2013-04-30 LAB — URIC ACID: Uric Acid: 5.3 mg/dL (ref 3.5–7.2)

## 2013-05-01 LAB — PROT IMMUNOELECTROPHORES(ARMC)

## 2013-05-01 LAB — KAPPA/LAMBDA FREE LIGHT CHAINS (ARMC)

## 2013-05-14 LAB — CBC CANCER CENTER
Basophil #: 0 x10 3/mm (ref 0.0–0.1)
Basophil %: 0.4 %
Eosinophil #: 0 x10 3/mm (ref 0.0–0.7)
Eosinophil %: 0.7 %
HCT: 44.8 % (ref 40.0–52.0)
HGB: 15.1 g/dL (ref 13.0–18.0)
Lymphocyte #: 2.4 x10 3/mm (ref 1.0–3.6)
Lymphocyte %: 37.1 %
MCH: 32.9 pg (ref 26.0–34.0)
MCHC: 33.8 g/dL (ref 32.0–36.0)
MCV: 97 fL (ref 80–100)
MONO ABS: 0.6 x10 3/mm (ref 0.2–1.0)
Monocyte %: 9.3 %
NEUTROS ABS: 3.4 x10 3/mm (ref 1.4–6.5)
Neutrophil %: 52.5 %
Platelet: 165 x10 3/mm (ref 150–440)
RBC: 4.6 10*6/uL (ref 4.40–5.90)
RDW: 13.7 % (ref 11.5–14.5)
WBC: 6.5 x10 3/mm (ref 3.8–10.6)

## 2013-05-14 LAB — CREATININE, SERUM
Creatinine: 1.27 mg/dL (ref 0.60–1.30)
EGFR (Non-African Amer.): 58 — ABNORMAL LOW

## 2013-05-20 ENCOUNTER — Ambulatory Visit: Payer: Self-pay | Admitting: Internal Medicine

## 2013-05-21 LAB — CBC CANCER CENTER
BASOS PCT: 0.4 %
Basophil #: 0 x10 3/mm (ref 0.0–0.1)
EOS ABS: 0.1 x10 3/mm (ref 0.0–0.7)
Eosinophil %: 1 %
HCT: 44 % (ref 40.0–52.0)
HGB: 14.8 g/dL (ref 13.0–18.0)
Lymphocyte #: 2.7 x10 3/mm (ref 1.0–3.6)
Lymphocyte %: 45.8 %
MCH: 32.8 pg (ref 26.0–34.0)
MCHC: 33.6 g/dL (ref 32.0–36.0)
MCV: 98 fL (ref 80–100)
Monocyte #: 0.6 x10 3/mm (ref 0.2–1.0)
Monocyte %: 9.6 %
Neutrophil #: 2.6 x10 3/mm (ref 1.4–6.5)
Neutrophil %: 43.2 %
Platelet: 135 x10 3/mm — ABNORMAL LOW (ref 150–440)
RBC: 4.5 10*6/uL (ref 4.40–5.90)
RDW: 13.4 % (ref 11.5–14.5)
WBC: 5.9 x10 3/mm (ref 3.8–10.6)

## 2013-05-28 LAB — CBC CANCER CENTER
BASOS PCT: 0.2 %
Basophil #: 0 x10 3/mm (ref 0.0–0.1)
Eosinophil #: 0 x10 3/mm (ref 0.0–0.7)
Eosinophil %: 0.6 %
HCT: 43 % (ref 40.0–52.0)
HGB: 14.4 g/dL (ref 13.0–18.0)
LYMPHS ABS: 2.4 x10 3/mm (ref 1.0–3.6)
LYMPHS PCT: 46.5 %
MCH: 32.8 pg (ref 26.0–34.0)
MCHC: 33.5 g/dL (ref 32.0–36.0)
MCV: 98 fL (ref 80–100)
MONOS PCT: 7.9 %
Monocyte #: 0.4 x10 3/mm (ref 0.2–1.0)
NEUTROS ABS: 2.3 x10 3/mm (ref 1.4–6.5)
Neutrophil %: 44.8 %
PLATELETS: 144 x10 3/mm — AB (ref 150–440)
RBC: 4.39 10*6/uL — AB (ref 4.40–5.90)
RDW: 13.6 % (ref 11.5–14.5)
WBC: 5.1 x10 3/mm (ref 3.8–10.6)

## 2013-06-04 LAB — CBC CANCER CENTER
Basophil #: 0 x10 3/mm (ref 0.0–0.1)
Basophil %: 0.2 %
EOS ABS: 0 x10 3/mm (ref 0.0–0.7)
Eosinophil %: 0.4 %
HCT: 45 % (ref 40.0–52.0)
HGB: 14.9 g/dL (ref 13.0–18.0)
Lymphocyte #: 2.3 x10 3/mm (ref 1.0–3.6)
Lymphocyte %: 37.4 %
MCH: 32.6 pg (ref 26.0–34.0)
MCHC: 33.1 g/dL (ref 32.0–36.0)
MCV: 99 fL (ref 80–100)
Monocyte #: 0.5 x10 3/mm (ref 0.2–1.0)
Monocyte %: 8.5 %
Neutrophil #: 3.4 x10 3/mm (ref 1.4–6.5)
Neutrophil %: 53.5 %
Platelet: 159 x10 3/mm (ref 150–440)
RBC: 4.57 10*6/uL (ref 4.40–5.90)
RDW: 13.2 % (ref 11.5–14.5)
WBC: 6.3 x10 3/mm (ref 3.8–10.6)

## 2013-06-04 LAB — CREATININE, SERUM
Creatinine: 1.11 mg/dL (ref 0.60–1.30)
EGFR (African American): >60
EGFR (Non-African Amer.): >60

## 2013-06-11 LAB — CBC CANCER CENTER
BASOS PCT: 0.2 %
Basophil #: 0 x10 3/mm (ref 0.0–0.1)
EOS ABS: 0 x10 3/mm (ref 0.0–0.7)
EOS PCT: 0.5 %
HCT: 44.1 % (ref 40.0–52.0)
HGB: 14.6 g/dL (ref 13.0–18.0)
LYMPHS ABS: 2 x10 3/mm (ref 1.0–3.6)
LYMPHS PCT: 32.7 %
MCH: 32.3 pg (ref 26.0–34.0)
MCHC: 33.1 g/dL (ref 32.0–36.0)
MCV: 98 fL (ref 80–100)
MONOS PCT: 10.1 %
Monocyte #: 0.6 x10 3/mm (ref 0.2–1.0)
NEUTROS PCT: 56.5 %
Neutrophil #: 3.5 x10 3/mm (ref 1.4–6.5)
Platelet: 152 x10 3/mm (ref 150–440)
RBC: 4.52 10*6/uL (ref 4.40–5.90)
RDW: 12.9 % (ref 11.5–14.5)
WBC: 6.2 x10 3/mm (ref 3.8–10.6)

## 2013-06-12 LAB — KAPPA/LAMBDA FREE LIGHT CHAINS (ARMC)

## 2013-06-20 ENCOUNTER — Ambulatory Visit: Payer: Self-pay | Admitting: Internal Medicine

## 2013-07-02 LAB — COMPREHENSIVE METABOLIC PANEL
ANION GAP: 5 — AB (ref 7–16)
AST: 13 U/L — AB (ref 15–37)
Albumin: 3.9 g/dL (ref 3.4–5.0)
Alkaline Phosphatase: 64 U/L
BUN: 15 mg/dL (ref 7–18)
Bilirubin,Total: 0.3 mg/dL (ref 0.2–1.0)
CHLORIDE: 104 mmol/L (ref 98–107)
CREATININE: 1.18 mg/dL (ref 0.60–1.30)
Calcium, Total: 9.5 mg/dL (ref 8.5–10.1)
Co2: 33 mmol/L — ABNORMAL HIGH (ref 21–32)
EGFR (African American): >60
EGFR (Non-African Amer.): >60
Glucose: 79 mg/dL (ref 65–99)
Osmolality: 283 (ref 275–301)
POTASSIUM: 4.3 mmol/L (ref 3.5–5.1)
SGPT (ALT): 22 U/L (ref 12–78)
Sodium: 142 mmol/L (ref 136–145)
TOTAL PROTEIN: 7.1 g/dL (ref 6.4–8.2)

## 2013-07-02 LAB — CBC CANCER CENTER
Basophil #: 0 x10 3/mm (ref 0.0–0.1)
Basophil %: 0.5 %
EOS ABS: 0.1 x10 3/mm (ref 0.0–0.7)
Eosinophil %: 1.4 %
HCT: 44.5 % (ref 40.0–52.0)
HGB: 14.8 g/dL (ref 13.0–18.0)
LYMPHS ABS: 3.1 x10 3/mm (ref 1.0–3.6)
Lymphocyte %: 42.7 %
MCH: 31.9 pg (ref 26.0–34.0)
MCHC: 33.1 g/dL (ref 32.0–36.0)
MCV: 96 fL (ref 80–100)
MONO ABS: 0.8 x10 3/mm (ref 0.2–1.0)
Monocyte %: 10.3 %
NEUTROS ABS: 3.3 x10 3/mm (ref 1.4–6.5)
NEUTROS PCT: 45.1 %
Platelet: 178 x10 3/mm (ref 150–440)
RBC: 4.63 10*6/uL (ref 4.40–5.90)
RDW: 12.7 % (ref 11.5–14.5)
WBC: 7.4 x10 3/mm (ref 3.8–10.6)

## 2013-07-03 LAB — KAPPA/LAMBDA FREE LIGHT CHAINS (ARMC)

## 2013-07-11 LAB — CBC CANCER CENTER
BASOS ABS: 0 x10 3/mm (ref 0.0–0.1)
Basophil %: 0.5 %
EOS ABS: 0.3 x10 3/mm (ref 0.0–0.7)
EOS PCT: 4.5 %
HCT: 43.3 % (ref 40.0–52.0)
HGB: 14.5 g/dL (ref 13.0–18.0)
LYMPHS PCT: 39.6 %
Lymphocyte #: 2.3 x10 3/mm (ref 1.0–3.6)
MCH: 31.9 pg (ref 26.0–34.0)
MCHC: 33.4 g/dL (ref 32.0–36.0)
MCV: 96 fL (ref 80–100)
Monocyte #: 0.5 x10 3/mm (ref 0.2–1.0)
Monocyte %: 8 %
Neutrophil #: 2.8 x10 3/mm (ref 1.4–6.5)
Neutrophil %: 47.4 %
PLATELETS: 211 x10 3/mm (ref 150–440)
RBC: 4.53 10*6/uL (ref 4.40–5.90)
RDW: 12.7 % (ref 11.5–14.5)
WBC: 5.9 x10 3/mm (ref 3.8–10.6)

## 2013-07-18 LAB — CBC CANCER CENTER
BASOS ABS: 0 x10 3/mm (ref 0.0–0.1)
BASOS PCT: 0.4 %
EOS ABS: 0.1 x10 3/mm (ref 0.0–0.7)
Eosinophil %: 1.8 %
HCT: 40.5 % (ref 40.0–52.0)
HGB: 14.2 g/dL (ref 13.0–18.0)
Lymphocyte #: 2.3 x10 3/mm (ref 1.0–3.6)
Lymphocyte %: 39.8 %
MCH: 32.6 pg (ref 26.0–34.0)
MCHC: 35 g/dL (ref 32.0–36.0)
MCV: 93 fL (ref 80–100)
MONO ABS: 0.5 x10 3/mm (ref 0.2–1.0)
Monocyte %: 8.2 %
NEUTROS ABS: 2.9 x10 3/mm (ref 1.4–6.5)
NEUTROS PCT: 49.8 %
Platelet: 168 x10 3/mm (ref 150–440)
RBC: 4.35 10*6/uL — ABNORMAL LOW (ref 4.40–5.90)
RDW: 12.7 % (ref 11.5–14.5)
WBC: 5.8 x10 3/mm (ref 3.8–10.6)

## 2013-07-18 LAB — CREATININE, SERUM: Creatinine: 1.12 mg/dL (ref 0.60–1.30)

## 2013-07-20 ENCOUNTER — Ambulatory Visit: Payer: Self-pay | Admitting: Internal Medicine

## 2013-07-25 LAB — CBC CANCER CENTER
Basophil #: 0.1 x10 3/mm (ref 0.0–0.1)
Basophil %: 0.9 %
EOS ABS: 0.1 x10 3/mm (ref 0.0–0.7)
EOS PCT: 1.4 %
HCT: 42.3 % (ref 40.0–52.0)
HGB: 14.4 g/dL (ref 13.0–18.0)
Lymphocyte #: 2.3 x10 3/mm (ref 1.0–3.6)
Lymphocyte %: 35.8 %
MCH: 32.5 pg (ref 26.0–34.0)
MCHC: 34.1 g/dL (ref 32.0–36.0)
MCV: 95 fL (ref 80–100)
MONOS PCT: 9.2 %
Monocyte #: 0.6 x10 3/mm (ref 0.2–1.0)
NEUTROS PCT: 52.7 %
Neutrophil #: 3.3 x10 3/mm (ref 1.4–6.5)
Platelet: 135 x10 3/mm — ABNORMAL LOW (ref 150–440)
RBC: 4.44 10*6/uL (ref 4.40–5.90)
RDW: 13.2 % (ref 11.5–14.5)
WBC: 6.3 x10 3/mm (ref 3.8–10.6)

## 2013-08-02 LAB — CBC CANCER CENTER
BASOS ABS: 0 x10 3/mm (ref 0.0–0.1)
BASOS PCT: 0.5 %
EOS ABS: 0.1 x10 3/mm (ref 0.0–0.7)
Eosinophil %: 1.2 %
HCT: 43.4 % (ref 40.0–52.0)
HGB: 14.7 g/dL (ref 13.0–18.0)
LYMPHS ABS: 2 x10 3/mm (ref 1.0–3.6)
LYMPHS PCT: 37.8 %
MCH: 32.4 pg (ref 26.0–34.0)
MCHC: 33.8 g/dL (ref 32.0–36.0)
MCV: 96 fL (ref 80–100)
MONO ABS: 0.5 x10 3/mm (ref 0.2–1.0)
MONOS PCT: 8.5 %
NEUTROS ABS: 2.8 x10 3/mm (ref 1.4–6.5)
NEUTROS PCT: 52 %
Platelet: 156 x10 3/mm (ref 150–440)
RBC: 4.52 10*6/uL (ref 4.40–5.90)
RDW: 13.3 % (ref 11.5–14.5)
WBC: 5.3 x10 3/mm (ref 3.8–10.6)

## 2013-08-03 LAB — KAPPA/LAMBDA FREE LIGHT CHAINS (ARMC)

## 2013-08-08 LAB — CBC CANCER CENTER
Basophil #: 0.1 x10 3/mm (ref 0.0–0.1)
Basophil %: 0.9 %
EOS ABS: 0.1 x10 3/mm (ref 0.0–0.7)
Eosinophil %: 0.9 %
HCT: 41 % (ref 40.0–52.0)
HGB: 14.2 g/dL (ref 13.0–18.0)
LYMPHS PCT: 37.7 %
Lymphocyte #: 2.1 x10 3/mm (ref 1.0–3.6)
MCH: 32.5 pg (ref 26.0–34.0)
MCHC: 34.6 g/dL (ref 32.0–36.0)
MCV: 94 fL (ref 80–100)
MONO ABS: 0.5 x10 3/mm (ref 0.2–1.0)
Monocyte %: 8.1 %
NEUTROS ABS: 2.9 x10 3/mm (ref 1.4–6.5)
Neutrophil %: 52.4 %
PLATELETS: 182 x10 3/mm (ref 150–440)
RBC: 4.37 10*6/uL — ABNORMAL LOW (ref 4.40–5.90)
RDW: 13.4 % (ref 11.5–14.5)
WBC: 5.6 x10 3/mm (ref 3.8–10.6)

## 2013-08-08 LAB — COMPREHENSIVE METABOLIC PANEL
ALBUMIN: 3.7 g/dL (ref 3.4–5.0)
ALK PHOS: 56 U/L
AST: 12 U/L — AB (ref 15–37)
Anion Gap: 8 (ref 7–16)
BUN: 17 mg/dL (ref 7–18)
Bilirubin,Total: 0.5 mg/dL (ref 0.2–1.0)
CHLORIDE: 106 mmol/L (ref 98–107)
Calcium, Total: 9.1 mg/dL (ref 8.5–10.1)
Co2: 30 mmol/L (ref 21–32)
Creatinine: 1.15 mg/dL (ref 0.60–1.30)
EGFR (African American): >60
EGFR (Non-African Amer.): >60
GLUCOSE: 159 mg/dL — AB (ref 65–99)
Osmolality: 292 (ref 275–301)
Potassium: 3.8 mmol/L (ref 3.5–5.1)
SGPT (ALT): 22 U/L (ref 12–78)
SODIUM: 144 mmol/L (ref 136–145)
Total Protein: 6.7 g/dL (ref 6.4–8.2)

## 2013-08-08 LAB — MAGNESIUM: Magnesium: 1.9 mg/dL

## 2013-08-20 ENCOUNTER — Ambulatory Visit: Payer: Self-pay | Admitting: Internal Medicine

## 2013-08-22 LAB — CBC CANCER CENTER
BASOS ABS: 0 x10 3/mm (ref 0.0–0.1)
Basophil %: 0.3 %
EOS PCT: 0.8 %
Eosinophil #: 0.1 x10 3/mm (ref 0.0–0.7)
HCT: 42.9 % (ref 40.0–52.0)
HGB: 14.4 g/dL (ref 13.0–18.0)
Lymphocyte #: 2.2 x10 3/mm (ref 1.0–3.6)
Lymphocyte %: 31.6 %
MCH: 32.3 pg (ref 26.0–34.0)
MCHC: 33.5 g/dL (ref 32.0–36.0)
MCV: 97 fL (ref 80–100)
MONO ABS: 0.5 x10 3/mm (ref 0.2–1.0)
MONOS PCT: 7.3 %
NEUTROS ABS: 4.2 x10 3/mm (ref 1.4–6.5)
Neutrophil %: 60 %
PLATELETS: 168 x10 3/mm (ref 150–440)
RBC: 4.44 10*6/uL (ref 4.40–5.90)
RDW: 13.8 % (ref 11.5–14.5)
WBC: 7.1 x10 3/mm (ref 3.8–10.6)

## 2013-08-22 LAB — CREATININE, SERUM
Creatinine: 1.3 mg/dL (ref 0.60–1.30)
EGFR (Non-African Amer.): 57 — ABNORMAL LOW

## 2013-08-27 LAB — CBC CANCER CENTER
Basophil #: 0 x10 3/mm (ref 0.0–0.1)
Basophil %: 0.2 %
EOS PCT: 0.5 %
Eosinophil #: 0 x10 3/mm (ref 0.0–0.7)
HCT: 40.7 % (ref 40.0–52.0)
HGB: 13.8 g/dL (ref 13.0–18.0)
Lymphocyte #: 2 x10 3/mm (ref 1.0–3.6)
Lymphocyte %: 29.5 %
MCH: 32.6 pg (ref 26.0–34.0)
MCHC: 33.8 g/dL (ref 32.0–36.0)
MCV: 96 fL (ref 80–100)
MONOS PCT: 10.3 %
Monocyte #: 0.7 x10 3/mm (ref 0.2–1.0)
Neutrophil #: 4 x10 3/mm (ref 1.4–6.5)
Neutrophil %: 59.5 %
PLATELETS: 162 x10 3/mm (ref 150–440)
RBC: 4.22 10*6/uL — AB (ref 4.40–5.90)
RDW: 14.2 % (ref 11.5–14.5)
WBC: 6.7 x10 3/mm (ref 3.8–10.6)

## 2013-08-27 LAB — COMPREHENSIVE METABOLIC PANEL
ALK PHOS: 57 U/L
ALT: 24 U/L (ref 12–78)
Albumin: 3.4 g/dL (ref 3.4–5.0)
Anion Gap: 8 (ref 7–16)
BILIRUBIN TOTAL: 0.5 mg/dL (ref 0.2–1.0)
BUN: 19 mg/dL — ABNORMAL HIGH (ref 7–18)
CALCIUM: 9.5 mg/dL (ref 8.5–10.1)
CHLORIDE: 105 mmol/L (ref 98–107)
CO2: 29 mmol/L (ref 21–32)
Creatinine: 1.19 mg/dL (ref 0.60–1.30)
GLUCOSE: 143 mg/dL — AB (ref 65–99)
Osmolality: 288 (ref 275–301)
POTASSIUM: 4 mmol/L (ref 3.5–5.1)
SGOT(AST): 11 U/L — ABNORMAL LOW (ref 15–37)
SODIUM: 142 mmol/L (ref 136–145)
TOTAL PROTEIN: 6.6 g/dL (ref 6.4–8.2)

## 2013-08-28 LAB — KAPPA/LAMBDA FREE LIGHT CHAINS (ARMC)

## 2013-09-12 LAB — CBC CANCER CENTER
BASOS PCT: 0.5 %
Basophil #: 0 x10 3/mm (ref 0.0–0.1)
EOS ABS: 0.1 x10 3/mm (ref 0.0–0.7)
EOS PCT: 1.7 %
HCT: 41.2 % (ref 40.0–52.0)
HGB: 14.2 g/dL (ref 13.0–18.0)
LYMPHS ABS: 2 x10 3/mm (ref 1.0–3.6)
LYMPHS PCT: 34.2 %
MCH: 33.1 pg (ref 26.0–34.0)
MCHC: 34.4 g/dL (ref 32.0–36.0)
MCV: 96 fL (ref 80–100)
MONO ABS: 0.6 x10 3/mm (ref 0.2–1.0)
MONOS PCT: 10.4 %
Neutrophil #: 3.1 x10 3/mm (ref 1.4–6.5)
Neutrophil %: 53.2 %
Platelet: 131 x10 3/mm — ABNORMAL LOW (ref 150–440)
RBC: 4.29 10*6/uL — ABNORMAL LOW (ref 4.40–5.90)
RDW: 14.1 % (ref 11.5–14.5)
WBC: 5.8 x10 3/mm (ref 3.8–10.6)

## 2013-09-12 LAB — CREATININE, SERUM
CREATININE: 1.22 mg/dL (ref 0.60–1.30)
EGFR (Non-African Amer.): >60

## 2013-09-19 ENCOUNTER — Ambulatory Visit: Payer: Self-pay | Admitting: Internal Medicine

## 2013-09-26 LAB — KAPPA/LAMBDA FREE LIGHT CHAINS (ARMC)

## 2013-10-01 LAB — CBC CANCER CENTER
Basophil #: 0 x10 3/mm (ref 0.0–0.1)
Basophil %: 0.3 %
EOS ABS: 0.1 x10 3/mm (ref 0.0–0.7)
Eosinophil %: 1.5 %
HCT: 40.3 % (ref 40.0–52.0)
HGB: 13.2 g/dL (ref 13.0–18.0)
Lymphocyte #: 1.8 x10 3/mm (ref 1.0–3.6)
Lymphocyte %: 35.5 %
MCH: 32.6 pg (ref 26.0–34.0)
MCHC: 32.8 g/dL (ref 32.0–36.0)
MCV: 100 fL (ref 80–100)
Monocyte #: 0.5 x10 3/mm (ref 0.2–1.0)
Monocyte %: 10.1 %
Neutrophil #: 2.6 x10 3/mm (ref 1.4–6.5)
Neutrophil %: 52.6 %
Platelet: 187 x10 3/mm (ref 150–440)
RBC: 4.05 10*6/uL — AB (ref 4.40–5.90)
RDW: 14.8 % — ABNORMAL HIGH (ref 11.5–14.5)
WBC: 5 x10 3/mm (ref 3.8–10.6)

## 2013-10-01 LAB — COMPREHENSIVE METABOLIC PANEL
ALT: 20 U/L (ref 12–78)
AST: 19 U/L (ref 15–37)
Albumin: 3.7 g/dL (ref 3.4–5.0)
Alkaline Phosphatase: 51 U/L
Anion Gap: 8 (ref 7–16)
BUN: 16 mg/dL (ref 7–18)
Bilirubin,Total: 0.5 mg/dL (ref 0.2–1.0)
CHLORIDE: 105 mmol/L (ref 98–107)
CO2: 29 mmol/L (ref 21–32)
Calcium, Total: 9.4 mg/dL (ref 8.5–10.1)
Creatinine: 1.23 mg/dL (ref 0.60–1.30)
EGFR (African American): >60
EGFR (Non-African Amer.): >60
GLUCOSE: 168 mg/dL — AB (ref 65–99)
Osmolality: 288 (ref 275–301)
Potassium: 3.9 mmol/L (ref 3.5–5.1)
SODIUM: 142 mmol/L (ref 136–145)
Total Protein: 6.5 g/dL (ref 6.4–8.2)

## 2013-10-01 LAB — MAGNESIUM: Magnesium: 1.7 mg/dL — ABNORMAL LOW

## 2013-10-08 LAB — CREATININE, SERUM
Creatinine: 1.28 mg/dL (ref 0.60–1.30)
GFR CALC NON AF AMER: 58 — AB

## 2013-10-09 LAB — KAPPA/LAMBDA FREE LIGHT CHAINS (ARMC)

## 2013-10-20 ENCOUNTER — Ambulatory Visit: Payer: Self-pay | Admitting: Internal Medicine

## 2013-10-22 LAB — FREE K+L LT CHAIN, QT, UR (24 HOUR)

## 2013-10-25 LAB — CBC CANCER CENTER
Basophil #: 0 x10 3/mm (ref 0.0–0.1)
Basophil %: 0.7 %
EOS PCT: 2.6 %
Eosinophil #: 0.1 x10 3/mm (ref 0.0–0.7)
HCT: 36.3 % — ABNORMAL LOW (ref 40.0–52.0)
HGB: 12.5 g/dL — ABNORMAL LOW (ref 13.0–18.0)
Lymphocyte #: 1.4 x10 3/mm (ref 1.0–3.6)
Lymphocyte %: 31.5 %
MCH: 34 pg (ref 26.0–34.0)
MCHC: 34.3 g/dL (ref 32.0–36.0)
MCV: 99 fL (ref 80–100)
MONO ABS: 0.4 x10 3/mm (ref 0.2–1.0)
Monocyte %: 7.8 %
NEUTROS ABS: 2.6 x10 3/mm (ref 1.4–6.5)
Neutrophil %: 57.4 %
Platelet: 180 x10 3/mm (ref 150–440)
RBC: 3.67 10*6/uL — ABNORMAL LOW (ref 4.40–5.90)
RDW: 14 % (ref 11.5–14.5)
WBC: 4.6 x10 3/mm (ref 3.8–10.6)

## 2013-10-25 LAB — CREATININE, SERUM
CREATININE: 1.48 mg/dL — AB (ref 0.60–1.30)
EGFR (African American): 56 — ABNORMAL LOW
EGFR (Non-African Amer.): 49 — ABNORMAL LOW

## 2013-10-27 LAB — KAPPA/LAMBDA FREE LIGHT CHAINS (ARMC)

## 2013-11-01 LAB — CBC CANCER CENTER
BASOS ABS: 0.1 x10 3/mm (ref 0.0–0.1)
Basophil %: 0.6 %
Eosinophil #: 0.1 x10 3/mm (ref 0.0–0.7)
Eosinophil %: 1.1 %
HCT: 37.6 % — ABNORMAL LOW (ref 40.0–52.0)
HGB: 12.5 g/dL — ABNORMAL LOW (ref 13.0–18.0)
LYMPHS ABS: 1.7 x10 3/mm (ref 1.0–3.6)
Lymphocyte %: 19.7 %
MCH: 32.9 pg (ref 26.0–34.0)
MCHC: 33.2 g/dL (ref 32.0–36.0)
MCV: 99 fL (ref 80–100)
MONO ABS: 1.6 x10 3/mm — AB (ref 0.2–1.0)
Monocyte %: 18.4 %
NEUTROS PCT: 60.2 %
Neutrophil #: 5.1 x10 3/mm (ref 1.4–6.5)
PLATELETS: 190 x10 3/mm (ref 150–440)
RBC: 3.8 10*6/uL — ABNORMAL LOW (ref 4.40–5.90)
RDW: 14.2 % (ref 11.5–14.5)
WBC: 8.6 x10 3/mm (ref 3.8–10.6)

## 2013-11-07 LAB — CBC CANCER CENTER
BASOS ABS: 0 x10 3/mm (ref 0.0–0.1)
Basophil %: 0.7 %
Eosinophil #: 0.2 x10 3/mm (ref 0.0–0.7)
Eosinophil %: 3.9 %
HCT: 38.4 % — ABNORMAL LOW (ref 40.0–52.0)
HGB: 12.7 g/dL — AB (ref 13.0–18.0)
Lymphocyte #: 1.6 x10 3/mm (ref 1.0–3.6)
Lymphocyte %: 39.7 %
MCH: 32.7 pg (ref 26.0–34.0)
MCHC: 33 g/dL (ref 32.0–36.0)
MCV: 99 fL (ref 80–100)
MONO ABS: 0.7 x10 3/mm (ref 0.2–1.0)
MONOS PCT: 17.5 %
NEUTROS ABS: 1.5 x10 3/mm (ref 1.4–6.5)
NEUTROS PCT: 38.2 %
Platelet: 199 x10 3/mm (ref 150–440)
RBC: 3.87 10*6/uL — ABNORMAL LOW (ref 4.40–5.90)
RDW: 14.8 % — ABNORMAL HIGH (ref 11.5–14.5)
WBC: 4 x10 3/mm (ref 3.8–10.6)

## 2013-11-07 LAB — CREATININE, SERUM
CREATININE: 1.3 mg/dL (ref 0.60–1.30)
EGFR (African American): >60
EGFR (Non-African Amer.): 57 — ABNORMAL LOW

## 2013-11-08 LAB — KAPPA/LAMBDA FREE LIGHT CHAINS (ARMC)

## 2013-11-14 LAB — CBC CANCER CENTER
BASOS ABS: 0 x10 3/mm (ref 0.0–0.1)
BASOS PCT: 0.4 %
EOS ABS: 0 x10 3/mm (ref 0.0–0.7)
Eosinophil %: 1.1 %
HCT: 38.1 % — AB (ref 40.0–52.0)
HGB: 12.4 g/dL — ABNORMAL LOW (ref 13.0–18.0)
LYMPHS PCT: 47.9 %
Lymphocyte #: 2.1 x10 3/mm (ref 1.0–3.6)
MCH: 32.4 pg (ref 26.0–34.0)
MCHC: 32.5 g/dL (ref 32.0–36.0)
MCV: 100 fL (ref 80–100)
MONOS PCT: 9.8 %
Monocyte #: 0.4 x10 3/mm (ref 0.2–1.0)
NEUTROS PCT: 40.8 %
Neutrophil #: 1.8 x10 3/mm (ref 1.4–6.5)
PLATELETS: 269 x10 3/mm (ref 150–440)
RBC: 3.82 10*6/uL — ABNORMAL LOW (ref 4.40–5.90)
RDW: 14.6 % — AB (ref 11.5–14.5)
WBC: 4.3 x10 3/mm (ref 3.8–10.6)

## 2013-11-20 ENCOUNTER — Ambulatory Visit: Payer: Self-pay | Admitting: Internal Medicine

## 2013-11-23 LAB — CBC CANCER CENTER
BASOS ABS: 0.1 x10 3/mm (ref 0.0–0.1)
Basophil %: 3.4 %
Eosinophil #: 0 x10 3/mm (ref 0.0–0.7)
Eosinophil %: 0.8 %
HCT: 39.4 % — AB (ref 40.0–52.0)
HGB: 13 g/dL (ref 13.0–18.0)
LYMPHS ABS: 1.1 x10 3/mm (ref 1.0–3.6)
Lymphocyte %: 25.5 %
MCH: 32.6 pg (ref 26.0–34.0)
MCHC: 33.1 g/dL (ref 32.0–36.0)
MCV: 98 fL (ref 80–100)
Monocyte #: 0.3 x10 3/mm (ref 0.2–1.0)
Monocyte %: 6.3 %
NEUTROS ABS: 2.8 x10 3/mm (ref 1.4–6.5)
Neutrophil %: 64 %
Platelet: 251 x10 3/mm (ref 150–440)
RBC: 4 10*6/uL — ABNORMAL LOW (ref 4.40–5.90)
RDW: 14.8 % — AB (ref 11.5–14.5)
WBC: 4.4 x10 3/mm (ref 3.8–10.6)

## 2013-11-23 LAB — HEPATIC FUNCTION PANEL A (ARMC)
ALK PHOS: 60 U/L
ALT: 31 U/L
Albumin: 3.6 g/dL (ref 3.4–5.0)
BILIRUBIN TOTAL: 0.3 mg/dL (ref 0.2–1.0)
SGOT(AST): 18 U/L (ref 15–37)
TOTAL PROTEIN: 6.7 g/dL (ref 6.4–8.2)

## 2013-11-23 LAB — CREATININE, SERUM
Creatinine: 1.43 mg/dL — ABNORMAL HIGH (ref 0.60–1.30)
EGFR (Non-African Amer.): 51 — ABNORMAL LOW
GFR CALC AF AMER: 59 — AB

## 2013-11-26 LAB — KAPPA/LAMBDA FREE LIGHT CHAINS (ARMC)

## 2013-11-30 LAB — URIC ACID: Uric Acid: 5.4 mg/dL (ref 3.5–7.2)

## 2013-11-30 LAB — CREATININE, SERUM
Creatinine: 1.27 mg/dL (ref 0.60–1.30)
EGFR (African American): >60
GFR CALC NON AF AMER: 58 — AB

## 2013-12-06 LAB — CBC CANCER CENTER
BASOS ABS: 0.1 x10 3/mm (ref 0.0–0.1)
Basophil %: 2 %
EOS PCT: 11.1 %
Eosinophil #: 0.4 x10 3/mm (ref 0.0–0.7)
HCT: 39.4 % — AB (ref 40.0–52.0)
HGB: 13.1 g/dL (ref 13.0–18.0)
Lymphocyte #: 1.6 x10 3/mm (ref 1.0–3.6)
Lymphocyte %: 40.9 %
MCH: 32.7 pg (ref 26.0–34.0)
MCHC: 33.3 g/dL (ref 32.0–36.0)
MCV: 98 fL (ref 80–100)
MONOS PCT: 21.9 %
Monocyte #: 0.9 x10 3/mm (ref 0.2–1.0)
NEUTROS PCT: 24.1 %
Neutrophil #: 1 x10 3/mm — ABNORMAL LOW (ref 1.4–6.5)
Platelet: 191 x10 3/mm (ref 150–440)
RBC: 4.01 10*6/uL — ABNORMAL LOW (ref 4.40–5.90)
RDW: 14.8 % — ABNORMAL HIGH (ref 11.5–14.5)
WBC: 4 x10 3/mm (ref 3.8–10.6)

## 2013-12-06 LAB — CREATININE, SERUM
Creatinine: 1.24 mg/dL (ref 0.60–1.30)
EGFR (African American): >60
EGFR (Non-African Amer.): >60

## 2013-12-07 LAB — KAPPA/LAMBDA FREE LIGHT CHAINS (ARMC)

## 2013-12-10 LAB — CBC CANCER CENTER
Basophil #: 0.1 x10 3/mm (ref 0.0–0.1)
Basophil %: 2.2 %
EOS ABS: 0.2 x10 3/mm (ref 0.0–0.7)
EOS PCT: 3.9 %
HCT: 40.4 % (ref 40.0–52.0)
HGB: 13.3 g/dL (ref 13.0–18.0)
Lymphocyte #: 1.8 x10 3/mm (ref 1.0–3.6)
Lymphocyte %: 47.1 %
MCH: 32.4 pg (ref 26.0–34.0)
MCHC: 33 g/dL (ref 32.0–36.0)
MCV: 98 fL (ref 80–100)
MONOS PCT: 16.4 %
Monocyte #: 0.6 x10 3/mm (ref 0.2–1.0)
NEUTROS ABS: 1.2 x10 3/mm — AB (ref 1.4–6.5)
Neutrophil %: 30.4 %
Platelet: 203 x10 3/mm (ref 150–440)
RBC: 4.11 10*6/uL — AB (ref 4.40–5.90)
RDW: 14.7 % — AB (ref 11.5–14.5)
WBC: 3.9 x10 3/mm (ref 3.8–10.6)

## 2013-12-19 LAB — CREATININE, SERUM
CREATININE: 1.34 mg/dL — AB (ref 0.60–1.30)
EGFR (African American): >60
GFR CALC NON AF AMER: 57 — AB

## 2013-12-19 LAB — CBC CANCER CENTER
BASOS PCT: 1.4 %
Basophil #: 0.1 x10 3/mm (ref 0.0–0.1)
EOS ABS: 0.2 x10 3/mm (ref 0.0–0.7)
EOS PCT: 3.7 %
HCT: 38.4 % — ABNORMAL LOW (ref 40.0–52.0)
HGB: 12.6 g/dL — ABNORMAL LOW (ref 13.0–18.0)
LYMPHS ABS: 1.6 x10 3/mm (ref 1.0–3.6)
Lymphocyte %: 36.3 %
MCH: 32 pg (ref 26.0–34.0)
MCHC: 32.9 g/dL (ref 32.0–36.0)
MCV: 97 fL (ref 80–100)
Monocyte #: 0.5 x10 3/mm (ref 0.2–1.0)
Monocyte %: 11.8 %
Neutrophil #: 2 x10 3/mm (ref 1.4–6.5)
Neutrophil %: 46.8 %
PLATELETS: 279 x10 3/mm (ref 150–440)
RBC: 3.95 10*6/uL — AB (ref 4.40–5.90)
RDW: 15.3 % — AB (ref 11.5–14.5)
WBC: 4.3 x10 3/mm (ref 3.8–10.6)

## 2013-12-20 ENCOUNTER — Ambulatory Visit: Payer: Self-pay | Admitting: Internal Medicine

## 2013-12-24 LAB — PROT IMMUNOELECTROPHORES(ARMC)

## 2013-12-24 LAB — KAPPA/LAMBDA FREE LIGHT CHAINS (ARMC)

## 2013-12-31 LAB — CBC CANCER CENTER
Basophil #: 0 x10 3/mm (ref 0.0–0.1)
Basophil %: 0.4 %
EOS ABS: 0.5 x10 3/mm (ref 0.0–0.7)
Eosinophil %: 10.5 %
HCT: 42.6 % (ref 40.0–52.0)
HGB: 13.9 g/dL (ref 13.0–18.0)
LYMPHS ABS: 1.5 x10 3/mm (ref 1.0–3.6)
LYMPHS PCT: 33.9 %
MCH: 31.5 pg (ref 26.0–34.0)
MCHC: 32.6 g/dL (ref 32.0–36.0)
MCV: 97 fL (ref 80–100)
MONOS PCT: 19.4 %
Monocyte #: 0.9 x10 3/mm (ref 0.2–1.0)
NEUTROS ABS: 1.6 x10 3/mm (ref 1.4–6.5)
NEUTROS PCT: 35.8 %
PLATELETS: 237 x10 3/mm (ref 150–440)
RBC: 4.41 10*6/uL (ref 4.40–5.90)
RDW: 15.2 % — AB (ref 11.5–14.5)
WBC: 4.5 x10 3/mm (ref 3.8–10.6)

## 2013-12-31 LAB — CREATININE, SERUM
CREATININE: 1.24 mg/dL (ref 0.60–1.30)
EGFR (African American): >60

## 2014-01-17 LAB — CBC CANCER CENTER
BASOS PCT: 2.7 %
Basophil #: 0.1 x10 3/mm (ref 0.0–0.1)
EOS ABS: 0.1 x10 3/mm (ref 0.0–0.7)
EOS PCT: 4.4 %
HCT: 41.5 % (ref 40.0–52.0)
HGB: 13.6 g/dL (ref 13.0–18.0)
LYMPHS PCT: 44.5 %
Lymphocyte #: 1.5 x10 3/mm (ref 1.0–3.6)
MCH: 31 pg (ref 26.0–34.0)
MCHC: 32.9 g/dL (ref 32.0–36.0)
MCV: 95 fL (ref 80–100)
MONOS PCT: 8.4 %
Monocyte #: 0.3 x10 3/mm (ref 0.2–1.0)
NEUTROS ABS: 1.3 x10 3/mm — AB (ref 1.4–6.5)
Neutrophil %: 40 %
PLATELETS: 295 x10 3/mm (ref 150–440)
RBC: 4.4 10*6/uL (ref 4.40–5.90)
RDW: 15.2 % — AB (ref 11.5–14.5)
WBC: 3.4 x10 3/mm — ABNORMAL LOW (ref 3.8–10.6)

## 2014-01-17 LAB — CREATININE, SERUM
CREATININE: 1.31 mg/dL — AB (ref 0.60–1.30)
GFR CALC NON AF AMER: 58 — AB

## 2014-01-20 ENCOUNTER — Ambulatory Visit: Payer: Self-pay | Admitting: Internal Medicine

## 2014-02-11 LAB — CBC CANCER CENTER
Basophil #: 0.1 x10 3/mm (ref 0.0–0.1)
Basophil %: 1.3 %
Eosinophil #: 0.2 x10 3/mm (ref 0.0–0.7)
Eosinophil %: 3.5 %
HCT: 43.1 % (ref 40.0–52.0)
HGB: 14 g/dL (ref 13.0–18.0)
Lymphocyte #: 1.6 x10 3/mm (ref 1.0–3.6)
Lymphocyte %: 29 %
MCH: 30.5 pg (ref 26.0–34.0)
MCHC: 32.4 g/dL (ref 32.0–36.0)
MCV: 94 fL (ref 80–100)
Monocyte #: 1.2 x10 3/mm — ABNORMAL HIGH (ref 0.2–1.0)
Monocyte %: 21.9 %
Neutrophil #: 2.5 x10 3/mm (ref 1.4–6.5)
Neutrophil %: 44.3 %
Platelet: 297 x10 3/mm (ref 150–440)
RBC: 4.57 10*6/uL (ref 4.40–5.90)
RDW: 16.3 % — AB (ref 11.5–14.5)
WBC: 5.7 x10 3/mm (ref 3.8–10.6)

## 2014-02-11 LAB — CREATININE, SERUM
Creatinine: 1.17 mg/dL (ref 0.60–1.30)
EGFR (Non-African Amer.): >60

## 2014-02-19 ENCOUNTER — Ambulatory Visit: Payer: Self-pay | Admitting: Internal Medicine

## 2014-03-18 LAB — CBC CANCER CENTER
BASOS ABS: 0 x10 3/mm (ref 0.0–0.1)
BASOS PCT: 0.4 %
EOS ABS: 0.1 x10 3/mm (ref 0.0–0.7)
EOS PCT: 2.6 %
HCT: 42.7 % (ref 40.0–52.0)
HGB: 14 g/dL (ref 13.0–18.0)
LYMPHS ABS: 1.3 x10 3/mm (ref 1.0–3.6)
LYMPHS PCT: 22 %
MCH: 30.6 pg (ref 26.0–34.0)
MCHC: 32.8 g/dL (ref 32.0–36.0)
MCV: 93 fL (ref 80–100)
MONO ABS: 0.9 x10 3/mm (ref 0.2–1.0)
Monocyte %: 16.2 %
Neutrophil #: 3.4 x10 3/mm (ref 1.4–6.5)
Neutrophil %: 58.8 %
Platelet: 244 x10 3/mm (ref 150–440)
RBC: 4.59 10*6/uL (ref 4.40–5.90)
RDW: 15.9 % — AB (ref 11.5–14.5)
WBC: 5.7 x10 3/mm (ref 3.8–10.6)

## 2014-03-18 LAB — CREATININE, SERUM: CREATININE: 1.14 mg/dL (ref 0.60–1.30)

## 2014-03-22 ENCOUNTER — Ambulatory Visit: Payer: Self-pay | Admitting: Internal Medicine

## 2014-04-15 LAB — CBC CANCER CENTER
BASOS ABS: 0 x10 3/mm (ref 0.0–0.1)
Basophil %: 0.4 %
Eosinophil #: 0.1 x10 3/mm (ref 0.0–0.7)
Eosinophil %: 2.7 %
HCT: 42.9 % (ref 40.0–52.0)
HGB: 14.1 g/dL (ref 13.0–18.0)
Lymphocyte #: 1.5 x10 3/mm (ref 1.0–3.6)
Lymphocyte %: 33.8 %
MCH: 30.4 pg (ref 26.0–34.0)
MCHC: 32.9 g/dL (ref 32.0–36.0)
MCV: 92 fL (ref 80–100)
Monocyte #: 0.9 x10 3/mm (ref 0.2–1.0)
Monocyte %: 20.1 %
Neutrophil #: 1.9 x10 3/mm (ref 1.4–6.5)
Neutrophil %: 43 %
PLATELETS: 257 x10 3/mm (ref 150–440)
RBC: 4.65 10*6/uL (ref 4.40–5.90)
RDW: 16.6 % — AB (ref 11.5–14.5)
WBC: 4.4 x10 3/mm (ref 3.8–10.6)

## 2014-04-22 ENCOUNTER — Ambulatory Visit: Payer: Self-pay | Admitting: Internal Medicine

## 2014-05-21 ENCOUNTER — Ambulatory Visit
Admit: 2014-05-21 | Disposition: A | Discharge status: Home / Self Care | Payer: Self-pay | Attending: Internal Medicine | Admitting: Internal Medicine

## 2014-06-14 LAB — CBC CANCER CENTER
BASOS ABS: 0.1 x10 3/mm (ref 0.0–0.1)
Basophil %: 1 %
Eosinophil #: 0.1 x10 3/mm (ref 0.0–0.7)
Eosinophil %: 2.3 %
HCT: 39.6 % — ABNORMAL LOW (ref 40.0–52.0)
HGB: 13.1 g/dL (ref 13.0–18.0)
LYMPHS ABS: 1.7 x10 3/mm (ref 1.0–3.6)
Lymphocyte %: 32.9 %
MCH: 30.7 pg (ref 26.0–34.0)
MCHC: 33 g/dL (ref 32.0–36.0)
MCV: 93 fL (ref 80–100)
Monocyte #: 1.1 x10 3/mm — ABNORMAL HIGH (ref 0.2–1.0)
Monocyte %: 20.4 %
NEUTROS ABS: 2.3 x10 3/mm (ref 1.4–6.5)
Neutrophil %: 43.4 %
PLATELETS: 407 x10 3/mm (ref 150–440)
RBC: 4.25 10*6/uL — AB (ref 4.40–5.90)
RDW: 16.3 % — ABNORMAL HIGH (ref 11.5–14.5)
WBC: 5.3 x10 3/mm (ref 3.8–10.6)

## 2014-06-21 ENCOUNTER — Ambulatory Visit
Admit: 2014-06-21 | Disposition: A | Discharge status: Home / Self Care | Payer: Self-pay | Attending: Internal Medicine | Admitting: Internal Medicine

## 2014-07-09 NOTE — Op Note (Signed)
PATIENT NAME:  Spencer Watts, Spencer Watts MR#:  [id number] DATE OF BIRTH:  [birth year]  DATE OF PROCEDURE:  01/28/2012  PREOPERATIVE DIAGNOSIS: Chronic cholecystitis and cholelithiasis.   POSTOPERATIVE DIAGNOSIS:  Chronic cholecystitis and cholelithiasis.  PROCEDURE: Laparoscopic cholecystectomy.   SURGEON: Rodena Goldmann, M.D.   ANESTHESIA: General.   OPERATIVE PROCEDURE: With the patient in the supine position after the induction of appropriate general anesthesia, the patient's abdomen was prepped with ChloraPrep and draped with sterile towels. The patient was placed in the head down, feet up position. A small infraumbilical incision was made in the standard fashion, carried down bluntly through the subcutaneous tissue. The Veress needle was used to cannulate the peritoneal cavity. CO2 was insufflated to appropriate pressure measurements. When approximately 2.5 liters of CO2 were instilled, the Veress needle was withdrawn. An 11-mm [employer] port was inserted into the peritoneal cavity. Intraperitoneal position was confirmed and CO2 was re-insufflated. The patient was placed in the head up, feet down position and rotated slightly to the left side. A subxiphoid transverse incision was made and an 11-mm port was inserted under direct vision. Two lateral ports 5 mm in size were inserted under direct vision. The gallbladder was grasped, retracted superiorly and laterally. Multiple adhesions were encountered along the gallbladder wall. These adhesions were taken down with a combination of blunt and Bovie dissection. The hepatoduodenal ligament was identified. The cystic artery and cystic duct were identified. The cystic duct was clipped on the gallbladder side and opened. An on-table cholangiogram was attempted, but the catheter could not be inserted through the small cystic duct. Anatomy was then confirmed and a critical angle of safety identified. The cystic duct was doubly clipped on the common duct side and  divided. The cystic artery was then doubly clipped and divided. The gallbladder was then dissected free from its bed on the liver using hook and cautery apparatus. Once the gallbladder was free, the gallbladder removed through the upper midline port using the Endo Catch apparatus. The area was copiously suctioned and irrigated. The upper fascial incision was closed with figure-of-eight suture of 0 Vicryl using a suture passer. All ports were withdrawn without difficulty. The abdomen was then desufflated. Midline fascia was closed with figure-of-eight suture of 0 Vicryl and the skin was closed with 5-0 nylon. The areas were infiltrated with 0.25% Marcaine for postoperative pain control. Sterile dressings were applied. The patient was returned to the recovery room having tolerated the procedure well. Sponge, instrument, and needle counts were correct times two in the operating room.      ____________________________ Spencer Maze, MD rle:bjt D: 01/28/2012 13:31:00 ET T: 01/28/2012 14:03:43 ET JOB#: [id number]  cc: Spencer Maze, MD, <Dictator> Simonne Come. Inez Pilgrim, MD Rodena Goldmann MD ELECTRONICALLY SIGNED 02/03/2012 22:29

## 2014-07-10 LAB — CBC CANCER CENTER
Basophil #: 0 x10 3/mm (ref 0.0–0.1)
Basophil %: 0.2 %
EOS ABS: 0.1 x10 3/mm (ref 0.0–0.7)
Eosinophil %: 2.7 %
HCT: 40.3 % (ref 40.0–52.0)
HGB: 13.3 g/dL (ref 13.0–18.0)
Lymphocyte #: 1.5 x10 3/mm (ref 1.0–3.6)
Lymphocyte %: 37.4 %
MCH: 31 pg (ref 26.0–34.0)
MCHC: 33.1 g/dL (ref 32.0–36.0)
MCV: 94 fL (ref 80–100)
MONO ABS: 0.6 x10 3/mm (ref 0.2–1.0)
Monocyte %: 15.6 %
NEUTROS ABS: 1.8 x10 3/mm (ref 1.4–6.5)
Neutrophil %: 44.1 %
Platelet: 223 x10 3/mm (ref 150–440)
RBC: 4.3 10*6/uL — ABNORMAL LOW (ref 4.40–5.90)
RDW: 16.2 % — ABNORMAL HIGH (ref 11.5–14.5)
WBC: 4 x10 3/mm (ref 3.8–10.6)

## 2014-07-14 NOTE — Op Note (Signed)
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   POSTOPERATIVE DIAGNOSIS: Fistula-in-ano.   PROCEDURE PERFORMED: Rectal examination under anesthesia and sigmoidoscopy.   SURGEON: Rodena Goldmann III, MD  ANESTHESIA: General.   DESCRIPTION OF PROCEDURE: With the patient in the supine position after induction of appropriate general anesthesia, the patient was placed in lithotomy position, appropriately padded and positioned. On the left side of his buttock t approximately the 4:00 position in lithotomy there was a draining sinus tract. Digital palpation was undertaken of the rectum and no obvious abnormality was identified. The perianal area was infiltrated with 20 mL of 0.25% Marcaine to help dilate the rectum. The bivalve retractor was then inserted to visually inspect the anal canal. I could not see an obvious internal opening. Using the probe the area was carefully evaluated. The probe went directly cranial about approximately 10 cm. Internal opening could not be palpated with the finger in the anal canal. Probe was withdrawn and peroxide injected into the external opening looking for an internal opening. None could be identified through the entire rectal area. Sterile sigmoidoscope was brought to the table and the patient sigmoidoscoped to 25 cm. There did not appear to be any lesions or mucosal abnormalities. Careful withdrawing with the bowel distended and no internal opening could be identified on either side using peroxide technique. The rectum was again examined and no abnormalities were visualized. The probe was then inserted and again an internal opening could not be identified. The fistula was filled with fibrin glue and the procedure terminated.   ____________________________ Micheline Maze, MD rle:cms D: 04/13/2011 08:18:28 ET T: 04/13/2011 10:02:42 ET JOB#: [id number] cc: Rodena Goldmann  III, MD, <Dictator> Rodena Goldmann MD ELECTRONICALLY SIGNED 04/14/2011 7:57

## 2014-08-05 ENCOUNTER — Other Ambulatory Visit: Payer: Self-pay | Admitting: *Deleted

## 2014-08-05 DIAGNOSIS — C9000 Multiple myeloma not having achieved remission: Secondary | ICD-10-CM

## 2014-08-07 ENCOUNTER — Inpatient Hospital Stay: Payer: Medicare Other

## 2014-08-07 ENCOUNTER — Encounter (INDEPENDENT_AMBULATORY_CARE_PROVIDER_SITE_OTHER): Payer: Self-pay

## 2014-08-07 ENCOUNTER — Inpatient Hospital Stay: Payer: Medicare Other | Attending: Internal Medicine | Admitting: Internal Medicine

## 2014-08-07 VITALS — BP 127/72 | HR 70 | Temp 97.4°F | Resp 16 | Ht 71.0 in | Wt 185.2 lb

## 2014-08-07 DIAGNOSIS — Z7982 Long term (current) use of aspirin: Secondary | ICD-10-CM | POA: Diagnosis not present

## 2014-08-07 DIAGNOSIS — C9002 Multiple myeloma in relapse: Secondary | ICD-10-CM

## 2014-08-07 DIAGNOSIS — M898X9 Other specified disorders of bone, unspecified site: Secondary | ICD-10-CM | POA: Diagnosis not present

## 2014-08-07 DIAGNOSIS — Z79899 Other long term (current) drug therapy: Secondary | ICD-10-CM | POA: Diagnosis not present

## 2014-08-07 DIAGNOSIS — G629 Polyneuropathy, unspecified: Secondary | ICD-10-CM | POA: Diagnosis not present

## 2014-08-07 DIAGNOSIS — Z9221 Personal history of antineoplastic chemotherapy: Secondary | ICD-10-CM | POA: Diagnosis not present

## 2014-08-07 DIAGNOSIS — K611 Rectal abscess: Secondary | ICD-10-CM | POA: Diagnosis not present

## 2014-08-07 DIAGNOSIS — C9000 Multiple myeloma not having achieved remission: Secondary | ICD-10-CM | POA: Diagnosis not present

## 2014-08-07 DIAGNOSIS — F17210 Nicotine dependence, cigarettes, uncomplicated: Secondary | ICD-10-CM | POA: Insufficient documentation

## 2014-08-07 DIAGNOSIS — R0781 Pleurodynia: Secondary | ICD-10-CM | POA: Insufficient documentation

## 2014-08-07 LAB — CBC WITH DIFFERENTIAL/PLATELET
Basophils Absolute: 0 10*3/uL (ref 0–0.1)
Basophils Relative: 0 %
Eosinophils Absolute: 0.1 10*3/uL (ref 0–0.7)
Eosinophils Relative: 2 %
HEMATOCRIT: 44 % (ref 40.0–52.0)
Hemoglobin: 14.4 g/dL (ref 13.0–18.0)
LYMPHS ABS: 1.4 10*3/uL (ref 1.0–3.6)
Lymphocytes Relative: 29 %
MCH: 31.1 pg (ref 26.0–34.0)
MCHC: 32.8 g/dL (ref 32.0–36.0)
MCV: 94.7 fL (ref 80.0–100.0)
MONO ABS: 0.8 10*3/uL (ref 0.2–1.0)
Monocytes Relative: 16 %
Neutro Abs: 2.4 10*3/uL (ref 1.4–6.5)
Neutrophils Relative %: 51 %
Platelets: 245 10*3/uL (ref 150–440)
RBC: 4.64 MIL/uL (ref 4.40–5.90)
RDW: 16.5 % — ABNORMAL HIGH (ref 11.5–14.5)
WBC: 4.6 10*3/uL (ref 3.8–10.6)

## 2014-08-07 NOTE — Progress Notes (Signed)
[facility] Progress note   Referred by    This 69 y.o. male patient presents to the clinic for follow-up of multiple myeloma   Chief Complaint/Problem list   1. Multiple myeloma, in relapse , most recently a gradual increase in light chains on his current treatment, he has regular follow-up at [facility] 2. Prior, aseptic necrosis of the jaw, currently asymptomatic 3. 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 Chronic problems with rectal abscess that regularly drains causes waxing and waning pain has been prior evaluated by [facility]     HPI:  AS PRIOR REVIEWED, see  02/19/13 for summary . MOST RECENTLY, D/C REVLIMID IN NOV,  REPAIR RECTAL FISTULA . DR [place],  s/p 2 cycles velcade.  Lt chains up from 218 to 279 after cycle 2, then up to 585 after cycle 3, so [place].  had baseline ECHO which is normal.  STILL WAX AND WANE RECTAL SYMPTOMS, NEW PROBLEM RIGHT UPPER JAW OSTEONECROSIS, HAS SEEN DENTIST, HAS F/U PLANNED.[place] NOW PLANNED, HAS SEEN DR REEVES AT [facility], RECOMMENDED CYTOXAN QOD AND POMALYST 21 DAYS ON, 7 DAYS OFF, CYTOXAN STARTED 10/23/13, POMALYST STARTED 7/31. HAS HAD MORE DIFFUSE BONE PAIN, WAX AND WANE.  Marland Kitchen HAD CONSULT WITH DR Clydene Laming FROM [facility]. HAD SKELETAL XRAYS DONE AT [facility], AS PER PATIENT STABLE. HAS HAD RECURRENT OSTEONECROSIS RIGHT UPPER JAW OBSERVED BY DENTIST, TO SPONTANEOUS RESOLUTION            TODAY... NO ACUTE COMPLAINTS, NEUROPATHY STABLE RECTAL DRAINAGE SAME, ON AND OFF ,. NO SYMPTOMS RELATED TO OSTEONECROSIS JAW. SINCE LAST VISIT,  HAD [facility] F/U 4.Marland KitchenMarland KitchenLT CHAINS were up to 614, from 576, from, 3.87/387 in march.... CBC OK.  last SIEP NEG .   CURRENTLY ON pomalyst since 4/9 , he is currently in a mid cycle of his treatment, he continues on every other day Cytoxan, and weekly Medrol , Cough is better, he has some left rib pain     Review of Systems:  General: No acute distress, No  fatigue, No recent weight loss, fever chills or sweats   HEENT: No headache, dizziness, ear or jaw pain, or epistaxis   Lungs: No cough, shortness of breath, at rest, No SOBOE, No wheezing, No chest pain, No, hemoptysis  Cardiac: no chest pain, no palpitations, no orthostasis, no lower extremity    GI: no abdominal pain, nausea, vomiting, diahrrea, or reflux   GU: no dysuria, no hematuria,   Musculoskeletal: no back pain,  no acutely painful joints,   Extremities: no upper or lower extremity edema  Skin: no bruising, no rash  Neuro: no headache, no dizzy, no focal weakness   Psych: no anxiety, no depression   Allergies no allergies  Significant History/PMH: No past medical history on file. No past surgical history on file.          Smoking History:  smoker one pack per day  PFSH: Family History: Sr. had uterine cancer   Comments:   Social History:  History  Alcohol Use: Not on file    Additional Past Medical and Surgical History:    Home Medications: Prior to Admission medications   Medication Sig Start Date End Date Taking? Authorizing Provider  aspirin EC 81 MG tablet Take 81 mg by mouth.   Yes Historical Provider, MD  Cyclophosphamide 50 MG CAPS Take 50 mg by mouth. 07/05/14  Yes Historical Provider, MD  gabapentin (  NEURONTIN) 300 MG capsule Take 600 mg by mouth. 09/05/13  Yes Historical Provider, MD  Lactobacillus Rhamnosus, GG, (CULTURELLE) CAPS Take by mouth.   Yes Historical Provider, MD  methylPREDNISolone (MEDROL) 8 MG tablet As directed 01/11/14  Yes Historical Provider, MD  Multiple Vitamin (MULTI-VITAMINS) TABS Take by mouth.   Yes Historical Provider, MD  pomalidomide (POMALYST) 4 MG capsule Take 4 mg by mouth. 01/07/14  Yes Historical Provider, MD  ranitidine (ZANTAC) 150 MG tablet Take 150 mg by mouth.   Yes Historical Provider, MD    Vital Signs:  Blood pressure 127/72, pulse 70, temperature 97.4 F (36.3 C), temperature source Tympanic, resp. rate 16,  height _0  (1.803 m), weight 185 lb 3 oz (84 kg).  Physical Exam:  General: well developed, well nourished, and no acute distress  Mental Status: alert and oriented to person, place and time  Head, Ears, Nose,Throat: No thrush  Respiratory: no rales, rhonchi, or wheezing, no dullness  Cardiovascular: regular rate and rhythm  Gastrointestinal: soft, non tender, no masses or organomegaly  Musculoskeletal: no lower extremity edema no calf tenderness  Skin: no rashes, no bruises  Neurological: No gross focal weakness cranial nerves intact  Lymphatics: Not palpable, neck supraclavicular, submandibular, axilla    Psych: Mood, Affect, Unremarkable    Laboratory Results: Appointment on 08/07/2014  Component Date Value Ref Range Status  . WBC 08/07/2014 4.6  3.8 - 10.6 K/uL Final  . RBC 08/07/2014 4.64  4.40 - 5.90 MIL/uL Final  . Hemoglobin 08/07/2014 14.4  13.0 - 18.0 g/dL Final  . HCT 08/07/2014 44.0  40.0 - 52.0 % Final  . MCV 08/07/2014 94.7  80.0 - 100.0 fL Final  . MCH 08/07/2014 31.1  26.0 - 34.0 pg Final  . MCHC 08/07/2014 32.8  32.0 - 36.0 g/dL Final  . RDW 08/07/2014 16.5* 11.5 - 14.5 % Final  . Platelets 08/07/2014 245  150 - 440 K/uL Final  . Neutrophils Relative % 08/07/2014 51   Final  . Neutro Abs 08/07/2014 2.4  1.4 - 6.5 K/uL Final  . Lymphocytes Relative 08/07/2014 29   Final  . Lymphs Abs 08/07/2014 1.4  1.0 - 3.6 K/uL Final  . Monocytes Relative 08/07/2014 16   Final  . Monocytes Absolute 08/07/2014 0.8  0.2 - 1.0 K/uL Final  . Eosinophils Relative 08/07/2014 2   Final  . Eosinophils Absolute 08/07/2014 0.1  0 - 0.7 K/uL Final  . Basophils Relative 08/07/2014 0   Final  . Basophils Absolute 08/07/2014 0.0  0 - 0.1 K/uL Final          Radiology Results: No results found.         Assessment and Plan: Impression:   see also current illness and history of present illness. Light chains slowly progressive. Being considered for salvage therapy. He now has  some left rib pain is likely due to tumor. Blood counts still look good. Neuropathy is stable. Tolerating his chemotherapy well.    Plan:  I repeated a CBC today. He has a follow-up checkup on 2 weeks. They recheck his CBC and chemistries in his light chains. We will have advice regarding salvage therapy, possibly elatuzimab, possibly clinical trial, possibly tumor infiltrating lymphocytes, there are other available drugs including anti-CD30 antibody treatment. He may be recommended for repeat skeletal x-ray or a PET scan

## 2014-09-04 ENCOUNTER — Ambulatory Visit: Payer: Medicare Other | Admitting: Family Medicine

## 2014-09-04 ENCOUNTER — Other Ambulatory Visit: Payer: Medicare Other

## 2014-09-13 ENCOUNTER — Telehealth: Payer: Self-pay | Admitting: *Deleted

## 2014-09-13 NOTE — Telephone Encounter (Signed)
[facility] reports that they have received 2 requests for pomalyst from 2 different providers. I called patient as he was a No Show for his last appt and has an upcoming appt scheduled. He voiced his dissatisfaction of customer service related to Dr Inez Pilgrim leaving and not receiving a phone as was outlined in the letter he received. He also reported that he was already going to [facility] as well as being seen here so he decided to just transfer his care to [facility]. He requested I cancel any appt here.

## 2014-09-24 ENCOUNTER — Other Ambulatory Visit: Payer: Medicare Other

## 2014-09-24 ENCOUNTER — Ambulatory Visit: Payer: Medicare Other | Admitting: Hematology and Oncology

## 2015-08-24 IMAGING — US US EXTREM LOW VENOUS*L*
1 series · 13 of 24 positions shown · non-contrast
Comparison: None.

CLINICAL DATA: Left lower extremity pain and edema. History of
multiple myeloma.



[Series 1: us extrem low venous*left* · 0.12mm/px · 13 of 38 slices shown]
[im 1/38]
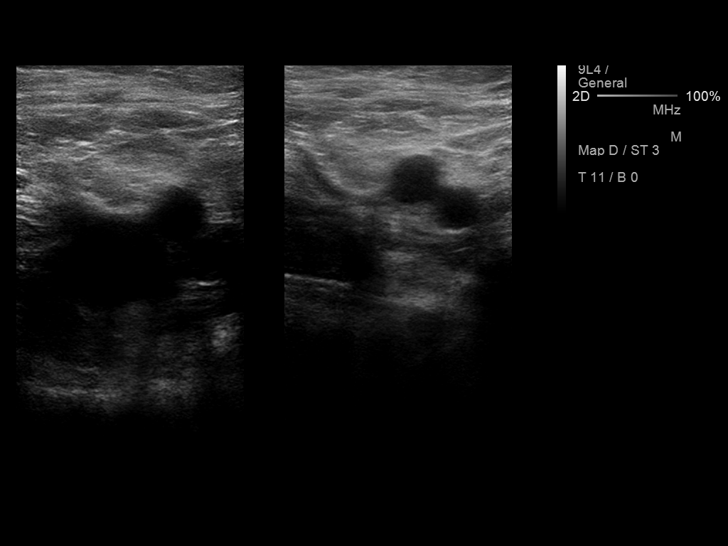
[im 4/38]
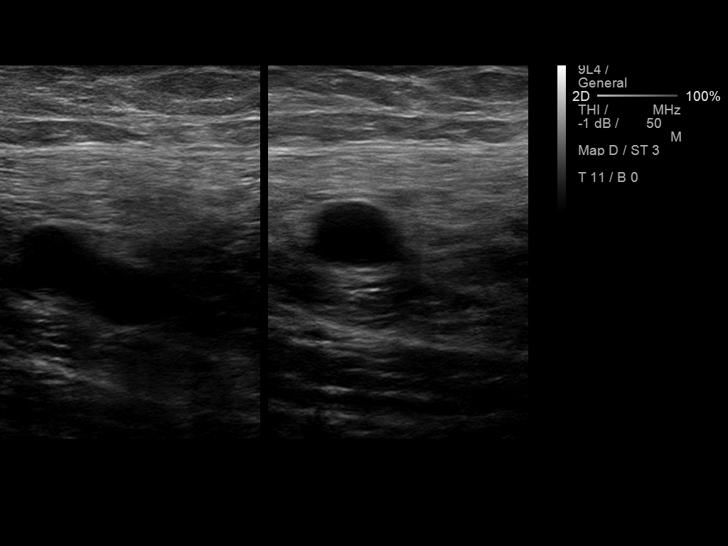
[im 7/38]
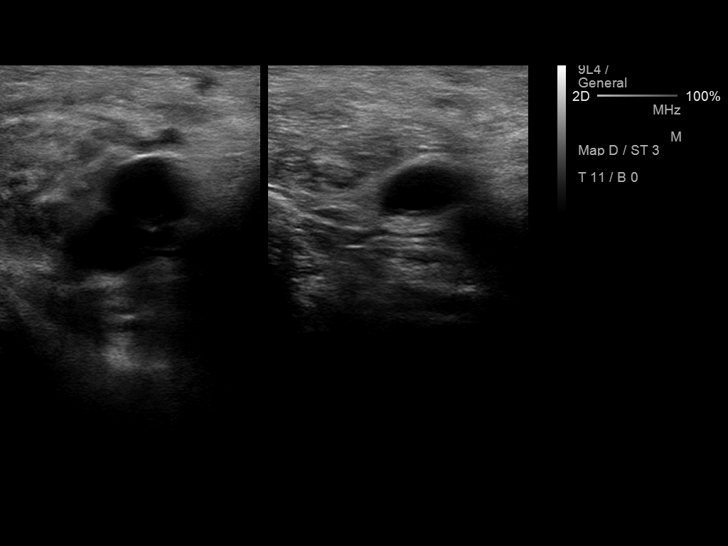
[im 10/38]
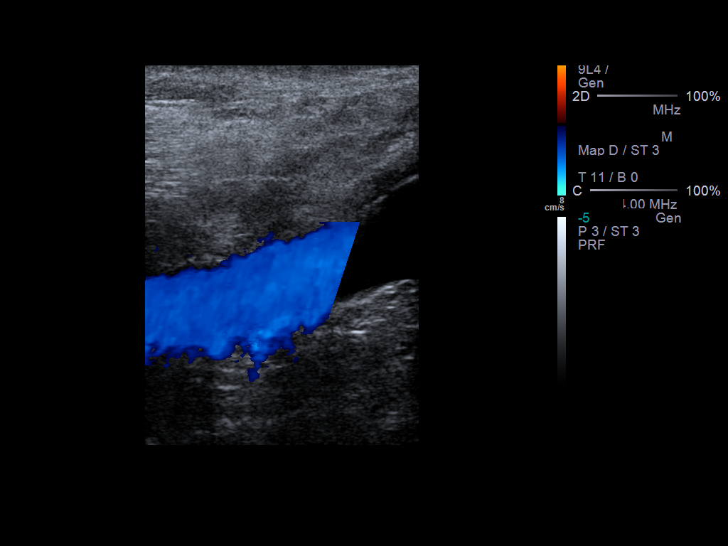
[im 13/38]
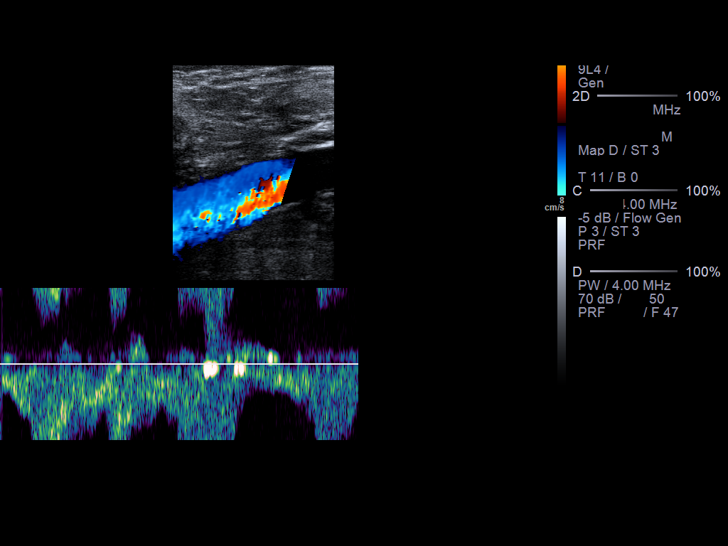
[im 17/38]
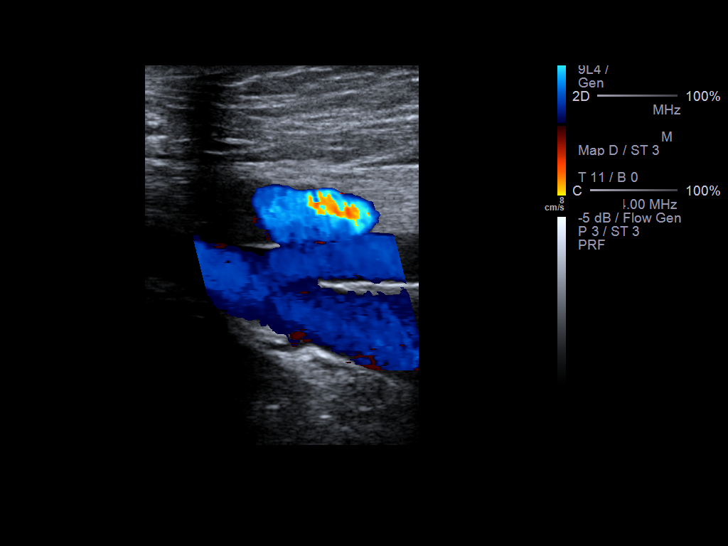
[im 20/38]
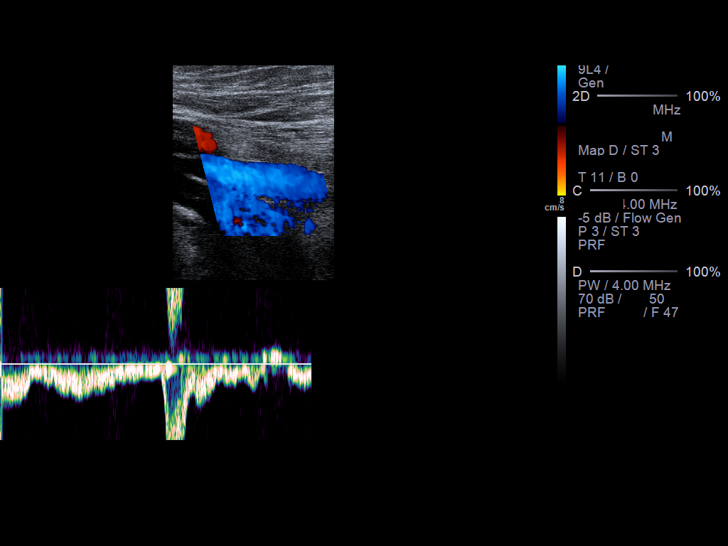
[im 21/38]
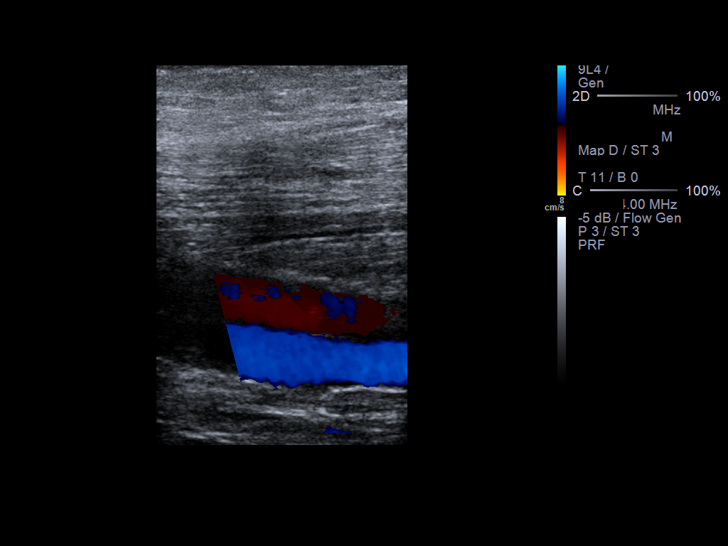
[im 25/38]
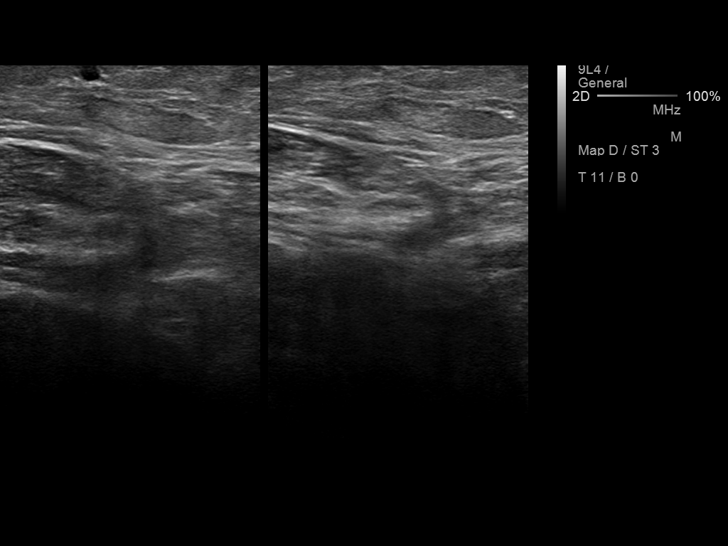
[im 28/38]
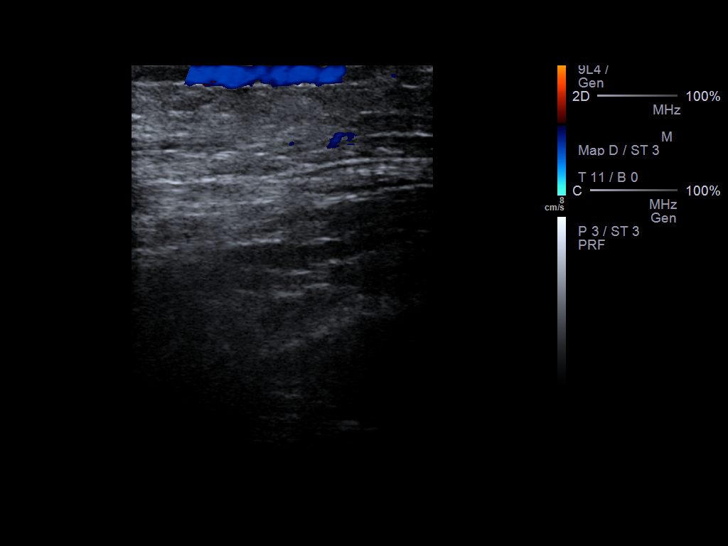
[im 31/38]
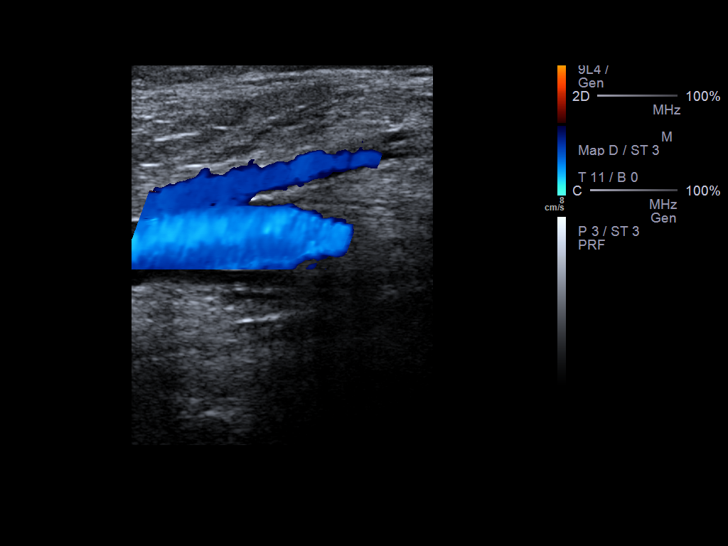
[im 34/38]
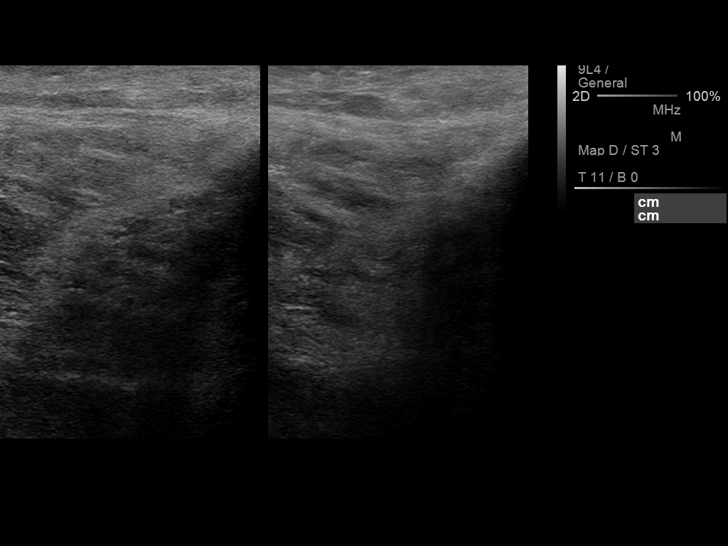
[im 38/38]
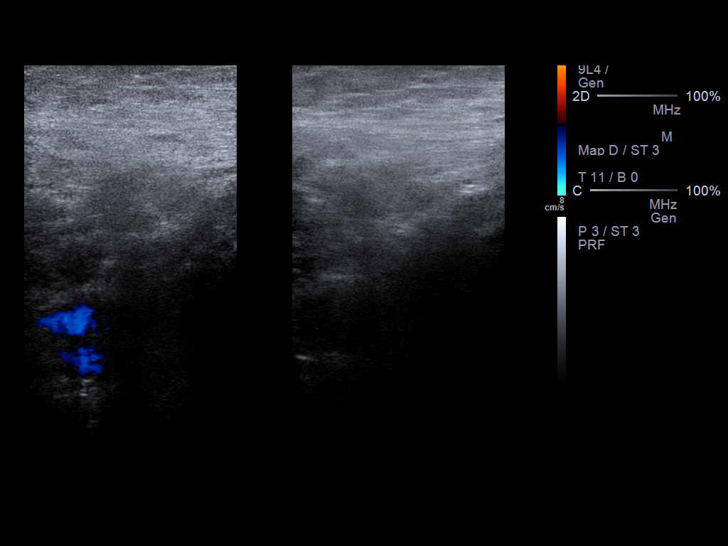

[13 of 24 positions shown; findings below may reference images not displayed]

FINDINGS: Common Femoral Vein: No evidence of thrombus. Normal
compressibility, respiratory phasicity and response to augmentation.

Saphenofemoral Junction: No evidence of thrombus. Normal
compressibility and flow on color Doppler imaging.

Profunda Femoral Vein: No evidence of thrombus. Normal
compressibility and flow on color Doppler imaging.

Femoral Vein: No evidence of thrombus. Normal compressibility,
respiratory phasicity and response to augmentation.

Popliteal Vein: No evidence of thrombus. Normal compressibility,
respiratory phasicity and response to augmentation.

Calf Veins: No evidence of thrombus. Normal compressibility and flow
on color Doppler imaging.

Superficial Great Saphenous Vein: No evidence of thrombus. Normal
compressibility and flow on color Doppler imaging.

Venous Reflux:  None.

Other Findings: No evidence of superficial thrombophlebitis or
abnormal fluid collection.
IMPRESSION: No evidence of left lower extremity deep venous thrombosis.

## 2016-09-19 DEATH — deceased
